# Patient Record
Sex: Female | Born: 1940 | Race: White | Hispanic: Refuse to answer | Smoking: Never smoker
Health system: Southern US, Community
[De-identification: ages and names within clinical notes are randomized; demographics above are authoritative.]

## PROBLEM LIST (undated history)

## (undated) DIAGNOSIS — R011 Cardiac murmur, unspecified: Secondary | ICD-10-CM

## (undated) DIAGNOSIS — I341 Nonrheumatic mitral (valve) prolapse: Secondary | ICD-10-CM

## (undated) DIAGNOSIS — M8580 Other specified disorders of bone density and structure, unspecified site: Secondary | ICD-10-CM

## (undated) DIAGNOSIS — K5790 Diverticulosis of intestine, part unspecified, without perforation or abscess without bleeding: Secondary | ICD-10-CM

## (undated) DIAGNOSIS — R569 Unspecified convulsions: Secondary | ICD-10-CM

## (undated) HISTORY — DX: Diverticulosis of intestine, part unspecified, without perforation or abscess without bleeding: K57.90

## (undated) HISTORY — DX: Nonrheumatic mitral (valve) prolapse: I34.1

## (undated) HISTORY — DX: Cardiac murmur, unspecified: R01.1

## (undated) HISTORY — DX: Unspecified convulsions: R56.9

## (undated) HISTORY — PX: TUBAL LIGATION: SHX77

## (undated) HISTORY — DX: Other specified disorders of bone density and structure, unspecified site: M85.80

---

## 1998-02-07 ENCOUNTER — Other Ambulatory Visit: Admission: RE | Admit: 1998-02-07 | Discharge: 1998-02-07 | Payer: Self-pay | Admitting: Obstetrics and Gynecology

## 1999-02-11 ENCOUNTER — Other Ambulatory Visit: Admission: RE | Admit: 1999-02-11 | Discharge: 1999-02-11 | Payer: Self-pay | Admitting: Obstetrics and Gynecology

## 1999-04-25 ENCOUNTER — Encounter: Admission: RE | Admit: 1999-04-25 | Discharge: 1999-04-25 | Payer: Self-pay | Admitting: Obstetrics and Gynecology

## 1999-04-25 ENCOUNTER — Encounter: Payer: Self-pay | Admitting: Obstetrics and Gynecology

## 2000-02-12 ENCOUNTER — Other Ambulatory Visit: Admission: RE | Admit: 2000-02-12 | Discharge: 2000-02-12 | Payer: Self-pay | Admitting: Obstetrics and Gynecology

## 2000-04-24 ENCOUNTER — Encounter: Payer: Self-pay | Admitting: Obstetrics and Gynecology

## 2000-04-24 ENCOUNTER — Encounter: Admission: RE | Admit: 2000-04-24 | Discharge: 2000-04-24 | Payer: Self-pay | Admitting: Obstetrics and Gynecology

## 2001-03-31 ENCOUNTER — Other Ambulatory Visit: Admission: RE | Admit: 2001-03-31 | Discharge: 2001-03-31 | Payer: Self-pay | Admitting: Obstetrics and Gynecology

## 2001-04-28 ENCOUNTER — Encounter: Admission: RE | Admit: 2001-04-28 | Discharge: 2001-04-28 | Payer: Self-pay | Admitting: Obstetrics and Gynecology

## 2001-04-28 ENCOUNTER — Encounter: Payer: Self-pay | Admitting: Obstetrics and Gynecology

## 2002-04-08 ENCOUNTER — Other Ambulatory Visit: Admission: RE | Admit: 2002-04-08 | Discharge: 2002-04-08 | Payer: Self-pay | Admitting: Obstetrics and Gynecology

## 2002-05-13 ENCOUNTER — Encounter: Admission: RE | Admit: 2002-05-13 | Discharge: 2002-05-13 | Payer: Self-pay | Admitting: Obstetrics and Gynecology

## 2002-05-13 ENCOUNTER — Encounter: Payer: Self-pay | Admitting: Obstetrics and Gynecology

## 2002-05-23 ENCOUNTER — Encounter: Payer: Self-pay | Admitting: Obstetrics and Gynecology

## 2002-05-23 ENCOUNTER — Encounter: Admission: RE | Admit: 2002-05-23 | Discharge: 2002-05-23 | Payer: Self-pay | Admitting: Obstetrics and Gynecology

## 2003-04-12 ENCOUNTER — Other Ambulatory Visit: Admission: RE | Admit: 2003-04-12 | Discharge: 2003-04-12 | Payer: Self-pay | Admitting: Obstetrics and Gynecology

## 2003-06-14 ENCOUNTER — Encounter: Admission: RE | Admit: 2003-06-14 | Discharge: 2003-06-14 | Payer: Self-pay | Admitting: Obstetrics and Gynecology

## 2004-05-17 ENCOUNTER — Other Ambulatory Visit: Admission: RE | Admit: 2004-05-17 | Discharge: 2004-05-17 | Payer: Self-pay | Admitting: Obstetrics and Gynecology

## 2004-06-20 ENCOUNTER — Encounter: Admission: RE | Admit: 2004-06-20 | Discharge: 2004-06-20 | Payer: Self-pay | Admitting: Obstetrics and Gynecology

## 2005-05-22 ENCOUNTER — Other Ambulatory Visit: Admission: RE | Admit: 2005-05-22 | Discharge: 2005-05-22 | Payer: Self-pay | Admitting: Obstetrics & Gynecology

## 2005-06-23 ENCOUNTER — Encounter: Admission: RE | Admit: 2005-06-23 | Discharge: 2005-06-23 | Payer: Self-pay | Admitting: Obstetrics and Gynecology

## 2006-06-24 ENCOUNTER — Other Ambulatory Visit: Admission: RE | Admit: 2006-06-24 | Discharge: 2006-06-24 | Payer: Self-pay | Admitting: Obstetrics & Gynecology

## 2006-06-26 ENCOUNTER — Encounter: Admission: RE | Admit: 2006-06-26 | Discharge: 2006-06-26 | Payer: Self-pay | Admitting: Obstetrics and Gynecology

## 2007-07-16 ENCOUNTER — Other Ambulatory Visit: Admission: RE | Admit: 2007-07-16 | Discharge: 2007-07-16 | Payer: Self-pay | Admitting: Obstetrics and Gynecology

## 2007-08-03 ENCOUNTER — Encounter: Admission: RE | Admit: 2007-08-03 | Discharge: 2007-08-03 | Payer: Self-pay | Admitting: Obstetrics and Gynecology

## 2007-11-18 ENCOUNTER — Encounter: Payer: Self-pay | Admitting: Pulmonary Disease

## 2008-06-14 ENCOUNTER — Ambulatory Visit: Payer: Self-pay | Admitting: Pulmonary Disease

## 2008-06-14 DIAGNOSIS — I059 Rheumatic mitral valve disease, unspecified: Secondary | ICD-10-CM | POA: Insufficient documentation

## 2008-06-14 DIAGNOSIS — J449 Chronic obstructive pulmonary disease, unspecified: Secondary | ICD-10-CM

## 2008-06-14 DIAGNOSIS — J309 Allergic rhinitis, unspecified: Secondary | ICD-10-CM | POA: Insufficient documentation

## 2008-06-14 DIAGNOSIS — R05 Cough: Secondary | ICD-10-CM

## 2008-06-14 DIAGNOSIS — J4489 Other specified chronic obstructive pulmonary disease: Secondary | ICD-10-CM | POA: Insufficient documentation

## 2008-06-19 ENCOUNTER — Ambulatory Visit: Payer: Self-pay | Admitting: Internal Medicine

## 2008-07-20 ENCOUNTER — Ambulatory Visit: Payer: Self-pay | Admitting: Pulmonary Disease

## 2008-07-20 DIAGNOSIS — J479 Bronchiectasis, uncomplicated: Secondary | ICD-10-CM

## 2008-08-15 ENCOUNTER — Encounter: Admission: RE | Admit: 2008-08-15 | Discharge: 2008-08-15 | Payer: Self-pay | Admitting: Obstetrics and Gynecology

## 2009-01-10 ENCOUNTER — Ambulatory Visit: Payer: Self-pay | Admitting: Pulmonary Disease

## 2009-01-16 ENCOUNTER — Encounter: Payer: Self-pay | Admitting: Pulmonary Disease

## 2009-08-08 ENCOUNTER — Ambulatory Visit: Payer: Self-pay | Admitting: Pulmonary Disease

## 2009-08-23 ENCOUNTER — Encounter: Admission: RE | Admit: 2009-08-23 | Discharge: 2009-08-23 | Payer: Self-pay | Admitting: Obstetrics and Gynecology

## 2009-09-05 ENCOUNTER — Telehealth (INDEPENDENT_AMBULATORY_CARE_PROVIDER_SITE_OTHER): Payer: Self-pay | Admitting: *Deleted

## 2010-01-14 ENCOUNTER — Ambulatory Visit: Payer: Self-pay | Admitting: Pulmonary Disease

## 2010-03-28 NOTE — Assessment & Plan Note (Signed)
Summary: rov for bronchiectasis   Primary Provider/Referring Provider:  Tristan Schroeder, MD  CC:  Pt is here for a 6 month f/u appt.  Pt denied sob.  Pt c/o coughing up dark yellow sputum. Pt also c/o hoarseness. Heidi Vasquez  History of Present Illness: the pt comes in today for f/u of her known bronchiectasis with airflow obstruction.  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  She has not had an acute exacerbation since the last visit, but continues to have intermittant discolored mucus at times that is hard to mobilize.  She denies any worsening of her breathing, and is maintaining her functional status.  Current Medications (verified): 1)  Calcium Carbonate-Vitamin D 600-400 Mg-Unit Tabs (Calcium Carbonate-Vitamin D) .... Take 1 Tablet By Mouth Once A Day 2)  Chlorpheniramine Maleate 4 Mg Tabs (Chlorpheniramine Maleate) .... Take 2 Tabs By Mouth At Bedtime As Needed 3)  Proair Hfa 108 (90 Base) Mcg/act  Aers (Albuterol Sulfate) .Heidi Vasquez.. 1-2 Puffs Every 4-6 Hours As Needed 4)  Symbicort 160-4.5 Mcg/act  Aero (Budesonide-Formoterol Fumarate) .... Two Puffs Twice Daily  Allergies (verified): No Known Drug Allergies  Review of Systems       The patient complains of productive cough.  The patient denies shortness of breath with activity, shortness of breath at rest, non-productive cough, coughing up blood, chest pain, irregular heartbeats, acid heartburn, indigestion, loss of appetite, weight change, abdominal pain, difficulty swallowing, sore throat, tooth/dental problems, headaches, nasal congestion/difficulty breathing through nose, sneezing, itching, ear ache, anxiety, depression, hand/feet swelling, joint stiffness or pain, rash, change in color of mucus, and fever.    Vital Signs:  Patient profile:   70 year old female Height:      66 inches Weight:      119.31 pounds BMI:      19.33 O2 Sat:      95 % on Room air Temp:     98.7 degrees F oral Pulse rate:   99 / minute BP sitting:   126 / 84  (left arm) Cuff size:   regular  Vitals Entered By: Arman Filter LPN (August 08, 2009 2:10 PM)  O2 Flow:  Room air CC: Pt is here for a 6 month f/u appt.  Pt denied sob.  Pt c/o coughing up dark yellow sputum. Pt also c/o hoarseness.  Comments Medications reviewed with patient Arman Filter LPN  August 08, 2009 2:10 PM    Physical Exam  General:  thin female in nad Nose:  no purulence noted. Lungs:  a few scattered crackles, no wheezing or rhonchi Heart:  rrr, no mrg Extremities:  no edema or cyanosis Neurologic:  alert and oriented, moves all 4.   Impression & Recommendations:  Problem # 1:  BRONCHIECTASIS (ICD-494.0) the pt is doing fairly well from this standpoint, with no recent exacerbations.  Her quantity of mucus remains limited, but she has difficulty mobilizing at times.  Will try her on a flutter valve to see if helps.  Problem # 2:  COPD, MILD (ICD-496) She has known obstructive airways disease related to her bronchiectasis, and feels that symbicort has helped her (did not see a response to qvar alone).  However, she is having issues with dysphonia that is bothering her.  Will change over to dulera as a trial, and if continues to have issues, may try her on foradil alone.  Other  Orders: Est. Patient Level III (91478)  Patient Instructions: 1)  will try flutter valve.Heidi Kitchenam and pm 2)  will try dulera in the place of symbicort for 3-4 weeks to see if it is easier on your voice...100/87mcg strength  2 inhalations am and pm, and rinse well.  Let me know if you need a prescription. 3)  stay as active as possible 4)  followup with me in 6mos.   Prescriptions: SYMBICORT 160-4.5 MCG/ACT  AERO (BUDESONIDE-FORMOTEROL FUMARATE) Two puffs twice daily  #1 x 6   Entered and Authorized by:   Barbaraann Share MD   Signed by:   Barbaraann Share MD on 08/08/2009   Method  used:   Print then Give to Patient   RxID:   [id number]

## 2010-03-28 NOTE — Progress Notes (Signed)
Summary: prescription  Phone Note Call from Patient Call back at Work Phone [phone]   Caller: Patient Call For: clance Summary of Call: Wants rx for dulera 100mg .//[facility] Initial call taken by: Darletta Moll,  September 05, 2009 9:41 AM    New/Updated Medications: DULERA 100-5 MCG/ACT AERO (MOMETASONE FURO-FORMOTEROL FUM) Take 2 puffs in am and 2 puffs in pm Prescriptions: DULERA 100-5 MCG/ACT AERO (MOMETASONE FURO-FORMOTEROL FUM) Take 2 puffs in am and 2 puffs in pm  #1 x 3   Entered by:   Abigail Miyamoto RN   Authorized by:   Barbaraann Share MD   Signed by:   Abigail Miyamoto RN on 09/05/2009   Method used:   Electronically to        [facility]  [address] [address] [id number] (retail)       [address]       [place], [place]  [address]       Ph: [phone] or [phone]       Fax: [phone]   RxID:   [id number]

## 2010-03-28 NOTE — Assessment & Plan Note (Signed)
Summary: rov for bronchiectasis/copd   Primary Heidi Vasquez/Referring Dalton Molesworth:  Tristan Schroeder, MD  CC:  f/u. pt states her breathing has been doing "okay". pt states she currently has laryngitis.  pt c/o post nasal drip. pt is never smoker.  History of Present Illness: the pt comes in today for f/u of her known bronchiectasis with copd.  She has had issues with dysphonia in the past with inhalers, but most recently has been tried on dulera, which she feels she tolerates well.  She has seen improvement in her breathing with this med.  She was doing well until most recently, and has come down with laryngitis and postnasal drip.  She denies any chest congestion or purulence.  She doesn't think her hoarseness is from the inhalers.  Current Medications (verified): 1)  Calcium Carbonate-Vitamin D 600-400 Mg-Unit Tabs (Calcium Carbonate-Vitamin D) .... Take 1 Tablet By Mouth Once A Day 2)  Chlorpheniramine Maleate 4 Mg Tabs (Chlorpheniramine Maleate) .... Take 2 Tabs By Mouth At Bedtime As Needed 3)  Proair Hfa 108 (90 Base) Mcg/act  Aers (Albuterol Sulfate) .Marland Kitchen.. 1-2 Puffs Every 4-6 Hours As Needed 4)  Dulera 100-5 Mcg/act Aero (Mometasone Furo-Formoterol Fum) .... Take 2 Puffs in Am and 2 Puffs in Pm  Allergies (verified): No Known Drug Allergies  Review of Systems       The patient complains of productive cough.  The patient denies shortness of breath with activity, shortness of breath at rest, non-productive cough, coughing up blood, chest pain, irregular heartbeats, acid heartburn, indigestion, loss of appetite, weight change, abdominal pain, difficulty swallowing, sore throat, tooth/dental problems, headaches, nasal congestion/difficulty breathing through nose, sneezing, itching, ear ache, anxiety, depression, hand/feet swelling, joint stiffness or pain, rash, change in color of mucus, and fever.    Vital Signs:  Patient profile:   70 year old female Height:      66 inches Weight:      122.25  pounds BMI:     19.80 O2 Sat:      97 % on Room air Temp:     98 degrees F oral Pulse rate:   86 / minute BP sitting:   122 / 78  (left arm) Cuff size:   regular  Vitals Entered By: Carver Fila (January 14, 2010 1:58 PM)  O2 Flow:  Room air CC: f/u. pt states her breathing has been doing "okay". pt states she currently has laryngitis.  pt c/o post nasal drip. pt is never smoker Comments meds and allergies updated Phone number updated Carver Fila  January 14, 2010 1:57 PM    Physical Exam  General:  thin female in nad Mouth:  mild thrush coating tongue. Lungs:  fairly clear to auscultation no wheezing or rhonchi Heart:  rrr Extremities:  no edema or cyanosis  Neurologic:  alert and oriented, moves all 4.   Impression & Recommendations:  Problem # 1:  BRONCHIECTASIS (ICD-494.0) the pt has not had a recent acute flare, and denies any significant cough.  I have reviewed with her again the "red flags" which indicate the need for abx.  Problem # 2:  COPD, MILD (ICD-496) the pt appears to be doing well with dulera, and I have asked her to continue with this.    Medications Added to Medication List This Visit: 1)  Nystatin 100000 Unit/ml Susp (Nystatin) .... Take one teaspoonful by mouth swish & swallow four times a day x 5 days  Other Orders: Est. Patient Level III (46962)  Patient Instructions: 1)  stay on dulera 2)  will give you a prescription for nystatin suspension to use as needed for "sore mouth". 3)  followup with me in 6mos.    Prescriptions: NYSTATIN 100000 UNIT/ML  SUSP (NYSTATIN) Take one teaspoonful by mouth swish & swallow four times a day x 5 days  #qs x 1   Entered and Authorized by:   Barbaraann Share MD   Signed by:   Barbaraann Share MD on 01/14/2010   Method used:   Print then Give to Patient   RxID:   [id number] DULERA 100-5 MCG/ACT AERO (MOMETASONE FURO-FORMOTEROL FUM) Take 2 puffs in am and 2 puffs in pm  #1 x 11   Entered and Authorized by:    Barbaraann Share MD   Signed by:   Barbaraann Share MD on 01/14/2010   Method used:   Print then Give to Patient   RxID:   [id number]    Immunization History:  Influenza Immunization History:    Influenza:  historical (11/24/2009)

## 2010-08-16 ENCOUNTER — Other Ambulatory Visit: Payer: Self-pay | Admitting: Obstetrics and Gynecology

## 2010-08-16 DIAGNOSIS — Z1231 Encounter for screening mammogram for malignant neoplasm of breast: Secondary | ICD-10-CM

## 2010-08-26 ENCOUNTER — Ambulatory Visit
Admission: RE | Admit: 2010-08-26 | Discharge: 2010-08-26 | Disposition: A | Discharge status: Home / Self Care | Payer: BC Managed Care – PPO | Source: Ambulatory Visit | Attending: Obstetrics and Gynecology | Admitting: Obstetrics and Gynecology

## 2010-08-26 DIAGNOSIS — Z1231 Encounter for screening mammogram for malignant neoplasm of breast: Secondary | ICD-10-CM

## 2010-10-01 ENCOUNTER — Encounter: Payer: Self-pay | Admitting: Pulmonary Disease

## 2010-10-02 ENCOUNTER — Encounter: Payer: Self-pay | Admitting: Pulmonary Disease

## 2010-10-02 ENCOUNTER — Ambulatory Visit: Payer: BC Managed Care – PPO | Admitting: Pulmonary Disease

## 2010-10-02 ENCOUNTER — Ambulatory Visit (INDEPENDENT_AMBULATORY_CARE_PROVIDER_SITE_OTHER): Payer: BC Managed Care – PPO | Admitting: Pulmonary Disease

## 2010-10-02 DIAGNOSIS — J449 Chronic obstructive pulmonary disease, unspecified: Secondary | ICD-10-CM

## 2010-10-02 DIAGNOSIS — J479 Bronchiectasis, uncomplicated: Secondary | ICD-10-CM

## 2010-10-02 NOTE — Patient Instructions (Signed)
Will try off dulera for a period of time to see if your breathing changes.  If you see worsening symptoms, get back on dulera and let us know. Can use albuterol 2 puffs up to every 6hrs if needed for rescue followup with me in 6mos.

## 2010-10-02 NOTE — Progress Notes (Signed)
  Subjective:    Patient ID: Heidi Vasquez, female    DOB: [birth year], 70 y.o.   MRN: [id number]  HPI The pt comes in today for f/u of her known bronchiectasis with mild airflow obstruction.  She is doing fairly well, with no recent chest infection or worsening of her breathing.  She does have an intermittant cough, with only scant mucus production.  She also has ongoing hoarseness that I suspect is due to her inhaler medication   Review of Systems  Constitutional: Negative.  Negative for fever and unexpected weight change.  HENT: Negative.  Negative for ear pain, nosebleeds, congestion, sore throat, rhinorrhea, sneezing, trouble swallowing, dental problem, postnasal drip and sinus pressure.   Eyes: Negative.  Negative for redness and itching.  Respiratory: Positive for cough. Negative for chest tightness, shortness of breath and wheezing.   Cardiovascular: Negative.  Negative for palpitations and leg swelling.  Gastrointestinal: Negative.  Negative for nausea and vomiting.  Genitourinary: Negative.  Negative for dysuria.  Musculoskeletal: Negative.  Negative for joint swelling.  Skin: Negative.  Negative for rash.  Neurological: Negative.  Negative for headaches.  Hematological: Negative.  Does not bruise/bleed easily.  Psychiatric/Behavioral: Negative.  Negative for dysphoric mood. The patient is not nervous/anxious.        Objective:   Physical Exam Wd female in nad Nares without purulence or discharge Chest with a few scattered crackles, no wheezing or rhonchi Cor with rrr LE without edema, no cyanosis Alert, oriented, moves all 4        Assessment & Plan:

## 2010-10-06 ENCOUNTER — Encounter: Payer: Self-pay | Admitting: Pulmonary Disease

## 2010-10-06 NOTE — Assessment & Plan Note (Signed)
The pt has mild airflow obstruction related to her bronchiectasis, but has not seen a huge change in symptoms with LABA/ICS medication.  She has been having ongoing issues with upper airway cough and hoarseness that are probably related to her inhalers?  Will try her off dulera to see if she notices a difference.  If she sees worsening of breathing or increase in mucus, may have to restart some type of inhaler medication.

## 2010-10-06 NOTE — Assessment & Plan Note (Signed)
The pt has not had an acute exacerbation since the last visit, and is not having chest congestion or significant purulence.

## 2011-01-06 ENCOUNTER — Other Ambulatory Visit: Payer: Self-pay | Admitting: *Deleted

## 2011-01-06 MED ORDER — MOMETASONE FURO-FORMOTEROL FUM 100-5 MCG/ACT IN AERO: 2.0000 | INHALATION_SPRAY | Freq: Two times a day (BID) | RESPIRATORY_TRACT | Status: DC

## 2011-01-06 NOTE — Telephone Encounter (Signed)
Received faxed refill request for [facility] 100/5 from [facility]. Pt last seen 10-02-10 and to follow up in 6 months. Refills sent.

## 2011-08-06 ENCOUNTER — Other Ambulatory Visit: Payer: Self-pay | Admitting: Obstetrics and Gynecology

## 2011-08-06 DIAGNOSIS — Z1231 Encounter for screening mammogram for malignant neoplasm of breast: Secondary | ICD-10-CM

## 2011-08-06 DIAGNOSIS — M8580 Other specified disorders of bone density and structure, unspecified site: Secondary | ICD-10-CM

## 2011-08-29 ENCOUNTER — Ambulatory Visit
Admission: RE | Admit: 2011-08-29 | Discharge: 2011-08-29 | Disposition: A | Discharge status: Home / Self Care | Payer: BC Managed Care – PPO | Source: Ambulatory Visit | Attending: Obstetrics and Gynecology | Admitting: Obstetrics and Gynecology

## 2011-08-29 DIAGNOSIS — Z1231 Encounter for screening mammogram for malignant neoplasm of breast: Secondary | ICD-10-CM

## 2011-08-29 DIAGNOSIS — M8580 Other specified disorders of bone density and structure, unspecified site: Secondary | ICD-10-CM

## 2012-08-20 ENCOUNTER — Other Ambulatory Visit: Payer: Self-pay

## 2012-08-20 DIAGNOSIS — Z1231 Encounter for screening mammogram for malignant neoplasm of breast: Secondary | ICD-10-CM

## 2012-09-06 ENCOUNTER — Ambulatory Visit
Admission: RE | Admit: 2012-09-06 | Discharge: 2012-09-06 | Disposition: A | Discharge status: Home / Self Care | Payer: BC Managed Care – PPO | Source: Ambulatory Visit

## 2012-09-06 DIAGNOSIS — Z1231 Encounter for screening mammogram for malignant neoplasm of breast: Secondary | ICD-10-CM

## 2012-10-20 ENCOUNTER — Encounter: Payer: Self-pay | Admitting: *Deleted

## 2012-10-20 ENCOUNTER — Encounter: Payer: Self-pay | Admitting: Obstetrics and Gynecology

## 2012-10-20 ENCOUNTER — Ambulatory Visit (INDEPENDENT_AMBULATORY_CARE_PROVIDER_SITE_OTHER): Payer: BC Managed Care – PPO | Admitting: Obstetrics and Gynecology

## 2012-10-20 VITALS — BP 120/76 | HR 72 | Resp 14 | Ht 65.0 in | Wt 124.0 lb

## 2012-10-20 DIAGNOSIS — Z01419 Encounter for gynecological examination (general) (routine) without abnormal findings: Secondary | ICD-10-CM

## 2012-10-20 DIAGNOSIS — Z0000 Encounter for general adult medical examination without abnormal findings: Secondary | ICD-10-CM

## 2012-10-20 LAB — LIPID PANEL
HDL: 68 mg/dL (ref >39)
LDL Cholesterol: 81 mg/dL (ref 0–99)
Total CHOL/HDL Ratio: 2.6 Ratio
VLDL: 26 mg/dL (ref 0–40)

## 2012-10-20 LAB — POCT URINALYSIS DIPSTICK
Blood, UA: 0
Urobilinogen, UA: NEGATIVE
pH, UA: 5

## 2012-10-20 LAB — HEMOGLOBIN, FINGERSTICK: Hemoglobin, fingerstick: 13.9 g/dL (ref 12.0–16.0)

## 2012-10-20 NOTE — Progress Notes (Signed)
72 y.o.   Divorced    Caucasian   female   G2P2   here for annual exam.    No LMP recorded. Patient is postmenopausal.          Sexually active: no  The current method of family planning is post menopausal status.    Exercising: Walking 5x/wk, weight bearing exercise couple x's/wk Last mammogram:  08/29/12 Last pap smear:  07/27/2009- Negative  History of abnormal pap: no Smoking: no Alcohol: no Last colonoscopy: 04/2008- q 5 years Last Bone Density:  08/29/11 Last tetanus shot: 10/20/11  Last cholesterol check:  Couple years ago   Hgb:   13.9g             Urine: Trace Leuks   Family History  Problem Relation Age of Onset  . Stroke Mother   . Diabetes Mother   . Hypertension Mother   . Colon cancer Father   . Breast cancer Sister     Patient Active Problem List   Diagnosis Date Noted  . 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 MITRAL VALVE PROLAPSE 06/14/2008  . ALLERGIC RHINITIS 06/14/2008  . 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 COUGH 06/14/2008    Past Medical History  Diagnosis Date  . Allergic rhinitis   . MVP (mitral valve prolapse)   . Osteopenia     Intolerant of Fosamax  . Diverticulosis     Past Surgical History  Procedure Laterality Date  . Tubal ligation      Allergies: Review of patient's allergies indicates no known allergies.  Current Outpatient Prescriptions  Medication Sig Dispense Refill  . albuterol (PROAIR HFA) 108 (90 BASE) MCG/ACT inhaler Inhale 2 puffs into the lungs every 6 (six) hours as needed.        . Calcium Carbonate-Vit D-Min 600-400 MG-UNIT TABS Take 1 tablet by mouth daily.        . chlorpheniramine (CHLOR-TRIMETON) 4 MG tablet Take 4 mg by mouth 2 (two) times daily as needed.        . mometasone-formoterol (DULERA) 100-5 MCG/ACT AERO Inhale 2 puffs into the lungs 2 (two) times daily.  1 Inhaler  5   No current facility-administered medications for this visit.    ROS: Pertinent items are noted in HPI.  Social Hx:  Divorced, two children, works as an  Corporate treasurer asst  Exam:    BP 120/76  Pulse 72  Resp 14  Ht 5\' 5"  (1.651 m)  Wt 124 lb (56.246 kg)  BMI 20.63 kg/m2 Ht and Wt stable from last year  Wt Readings from Last 3 Encounters:  10/20/12 124 lb (56.246 kg)  10/02/10 118 lb (53.524 kg)  01/14/10 122 lb 4 oz (55.452 kg)     Ht Readings from Last 3 Encounters:  10/20/12 5\' 5"  (1.651 m)  10/02/10 5\' 8"  (1.727 m)  01/14/10 5\' 6"  (1.676 m)    General appearance: alert, cooperative and appears stated age Head: Normocephalic, without obvious abnormality, atraumatic Neck: no adenopathy, supple, symmetrical, trachea midline and thyroid not enlarged, symmetric, no tenderness/mass/nodules Lungs: clear to auscultation bilaterally Breasts: Inspection negative, No nipple retraction or dimpling, No nipple discharge or bleeding, No axillary or supraclavicular adenopathy, Normal to palpation without dominant masses Heart: regular rate and rhythm Abdomen: soft, non-tender; bowel sounds normal; no masses,  no organomegaly Extremities: extremities normal, atraumatic, no cyanosis or edema Skin: Skin color, texture, turgor normal. No rashes or lesions Lymph nodes: Cervical, supraclavicular, and axillary nodes normal. No abnormal inguinal nodes palpated Neurologic: Grossly normal  Pelvic: External genitalia:  no lesions              Urethra:  normal appearing urethra with no masses, tenderness or lesions              Bartholins and Skenes: normal                 Vagina: normal appearing vagina with normal color and discharge, no lesions              Cervix: normal appearance              Pap taken: no        Bimanual Exam:  Uterus:  uterus is normal size, shape, consistency and nontender, AF, mobile                                      Adnexa: normal adnexa in size, nontender and no masses                                      Rectovaginal: Confirms                                      Anus:  normal sphincter tone, no lesions  A: normal  menopausal exam,n quit HRT June 2011     Sister with premeno breast cancer, colon ca in father     Hematemesis on fosamax       P:     mammogram counseled on breast self exam, mammography screening, adequate intake of calcium and vitamin D, diet and exercise return annually or prn   Rec: repeat BMD since there was such a significant delince in BMD when pt stopped her HRT   An After Visit Summary was printed and given to the patient.

## 2012-10-20 NOTE — Patient Instructions (Signed)

## 2012-10-22 ENCOUNTER — Telehealth: Payer: Self-pay | Admitting: Obstetrics and Gynecology

## 2012-10-22 NOTE — Telephone Encounter (Signed)
Patient is returning your call.  

## 2012-10-26 ENCOUNTER — Telehealth: Payer: Self-pay | Admitting: Obstetrics and Gynecology

## 2012-10-26 ENCOUNTER — Telehealth: Payer: Self-pay | Admitting: Gynecology

## 2012-10-26 NOTE — Telephone Encounter (Signed)
Patient needs an appointment for her bone density test.

## 2012-10-27 NOTE — Telephone Encounter (Addendum)
Last BMD in Epic 08-29-11. CR recommended at last visit that she repeat due to significant decrease when pt stopped HRT. OK to send order?

## 2012-10-28 NOTE — Telephone Encounter (Signed)
Pt notified of lab results on 10/26/12 cm

## 2012-11-01 NOTE — Telephone Encounter (Signed)
yes

## 2012-11-02 ENCOUNTER — Other Ambulatory Visit: Payer: Self-pay | Admitting: Orthopedic Surgery

## 2012-11-02 DIAGNOSIS — M8580 Other specified disorders of bone density and structure, unspecified site: Secondary | ICD-10-CM

## 2012-11-02 NOTE — Telephone Encounter (Signed)
[facility] for [facility].   Call to pt. Left message on VM that order has been entered for bone density at [facility]. Pt can call and make appt at her convenience.

## 2012-11-30 ENCOUNTER — Ambulatory Visit
Admission: RE | Admit: 2012-11-30 | Discharge: 2012-11-30 | Disposition: A | Discharge status: Home / Self Care | Payer: BC Managed Care – PPO | Source: Ambulatory Visit | Attending: Gynecology | Admitting: Gynecology

## 2012-11-30 DIAGNOSIS — M8580 Other specified disorders of bone density and structure, unspecified site: Secondary | ICD-10-CM

## 2012-12-10 ENCOUNTER — Telehealth: Payer: Self-pay | Admitting: Gynecology

## 2012-12-10 NOTE — Telephone Encounter (Signed)
Dr. Farrel Gobble, patient calling for results of Dexa. Can your review and advise?  *RADIOLOGY REPORT*  Clinical Data: 72 year old postmenopausal female with history of  low bone mass. The patient takes vitamin D. She has taken  hormones and Fosamax in the past.  DUAL X-RAY ABSORPTIOMETRY (DXA) FOR BONE MINERAL DENSITY  AP LUMBAR SPINE (L1 - L4)  Bone Mineral Density (BMD): 0.841 g/cm2  Young Adult T Score: -1.9  Z Score: 0.4  LEFT FEMUR (NECK)  Bone Mineral Density (BMD): 0.570 g/cm2  Young Adult T Score: -2.5  Z Score: -0.6  ASSESSMENT: Patient's diagnostic category is OSTEOPOROSIS by WHO  Criteria.  FRACTURE RISK: UNKNOWN  FRAX: World Health Organization FRAX assessment of absolute  fracture risk is not calculated for this patient because the  patient has osteoporosis and a history of bone building therapy.  Comparison: There has been no significant change in BMD in the  spine or total left hip as compared to baseline study dated  04/28/2001 or as compared to 08/29/2011.  RECOMMENDATIONS:  All patients should ensure an adequate intake of dietary calcium  (1200mg  daily) and vitamin D (800 IU daily) unless contraindicated.  The National Osteoporosis Foundation recommends that FDA-approved  medical therapies be considered in postmenopausal women and mean  age 41 or older with a:  1) Hip or vertebral (clinical or morphometric) fracture.  2) T-score of -2.5 or lower at the spine or hip.  3) Ten-year fracture probability by FRAX of 3% or greater for hip  fracture or 20% or greater for major osteoporotic fracture.  FOLLOW-UP:  People with diagnosed cases of osteoporosis or at high risk for  fracture should have regular bone mineral density tests. For  patients eligible for Medicare, routine testing is allowed once  every 2 years. The testing frequency can be increased to one year  for patients who have rapidly progressing disease, those who are  receiving or discontinuing medical  therapy to restore bone mass, or  have additional risk factors.  Original Report Authenticated By: Cain Saupe, M.D.

## 2012-12-10 NOTE — Telephone Encounter (Signed)
Patient is calling for results from a bone density test.

## 2012-12-14 NOTE — Telephone Encounter (Signed)
Compared multiple DEXA going back over several years, bones appear unchanged, stable. Please check with pt, report indicates that she was on fosamax and i cannot find in chart, ask her and if no, can we contact the breast center regarding correction and getting FRAX

## 2012-12-14 NOTE — Telephone Encounter (Signed)
Spoke with patient. Message from Dr. Farrel Gobble given. Patient verbalized understanding.   She states she was on Fosamax previously but she had to dc due to spitting up blood.

## 2012-12-16 ENCOUNTER — Encounter (INDEPENDENT_AMBULATORY_CARE_PROVIDER_SITE_OTHER): Payer: Self-pay

## 2012-12-16 NOTE — Telephone Encounter (Addendum)
Patient returned call. She was on Fosamax started by Dr. Tresa Res In September 07, 2001 and stopped in September  2003.

## 2012-12-16 NOTE — Telephone Encounter (Signed)
Spoke with patient. She is going to look through records and call back. States that Dr. Tresa Res started her on Fosamax in approximately 2010 or 2011.

## 2012-12-17 NOTE — Telephone Encounter (Signed)
Spoke with [facility], they will ask the provider to see if they can do a recalculation.

## 2012-12-17 NOTE — Telephone Encounter (Signed)
Can we ask them to do the frax then since it was such a short time

## 2012-12-27 NOTE — Telephone Encounter (Signed)
Dr. Farrel Gobble,  [facility] re-calculated Frax on report.

## 2012-12-30 ENCOUNTER — Other Ambulatory Visit: Payer: Self-pay

## 2013-08-02 ENCOUNTER — Other Ambulatory Visit: Payer: Self-pay

## 2013-08-02 DIAGNOSIS — Z1231 Encounter for screening mammogram for malignant neoplasm of breast: Secondary | ICD-10-CM

## 2013-09-09 ENCOUNTER — Ambulatory Visit
Admission: RE | Admit: 2013-09-09 | Discharge: 2013-09-09 | Disposition: A | Discharge status: Home / Self Care | Payer: BC Managed Care – PPO | Source: Ambulatory Visit

## 2013-09-09 DIAGNOSIS — Z1231 Encounter for screening mammogram for malignant neoplasm of breast: Secondary | ICD-10-CM

## 2013-10-21 ENCOUNTER — Ambulatory Visit (INDEPENDENT_AMBULATORY_CARE_PROVIDER_SITE_OTHER): Payer: BC Managed Care – PPO | Admitting: Gynecology

## 2013-10-21 ENCOUNTER — Encounter: Payer: Self-pay | Admitting: Gynecology

## 2013-10-21 VITALS — BP 124/76 | HR 80 | Resp 18 | Ht 65.5 in | Wt 125.0 lb

## 2013-10-21 DIAGNOSIS — Z23 Encounter for immunization: Secondary | ICD-10-CM

## 2013-10-21 DIAGNOSIS — Z0000 Encounter for general adult medical examination without abnormal findings: Secondary | ICD-10-CM

## 2013-10-21 DIAGNOSIS — Z01419 Encounter for gynecological examination (general) (routine) without abnormal findings: Secondary | ICD-10-CM

## 2013-10-21 LAB — POCT URINALYSIS DIPSTICK
UROBILINOGEN UA: NEGATIVE
pH, UA: 5

## 2013-10-21 LAB — HEMOGLOBIN, FINGERSTICK: HEMOGLOBIN, FINGERSTICK: 12.1 g/dL (ref 12.0–16.0)

## 2013-10-21 NOTE — Addendum Note (Signed)
Addended by: Alfonzo Feller on: 10/21/2013 02:46 PM   Modules accepted: Orders

## 2013-10-21 NOTE — Progress Notes (Signed)
73 y.o. Divorced Caucasian female   G2P2 here for annual exam. Pt reports menses are absent due to Menopause. She occasionally  report hot flashes, does not have night sweats, does not have vaginal dryness.  She is not using lubricants.  She does not report post-menopasual bleeding.  Pt had DEXA 2014, FRAX 4.8%/13% for hip and major fx-pt not interested in treatment.  Patient's last menstrual period was 02/25/1992.          Sexually active: No.  The current method of family planning is post menopausal status.    Exercising: Yes.    walking, weight bearing qd, 1-2x/wk Last pap: 07/27/09 Neg  Abnormal PAP:  no Mammogram: 09/12/13 Bi-Rads 1 BSE: occassionally Colonoscopy:  09/07/13- Normal f/u in 5 years ago DEXA: 12/01/12  Alcohol: no Tobacco: no  Hgb: 12.1  ; Urine:  Leuks 2  Health Maintenance  Topic Date Due  . Tetanus/tdap  09/19/1959  . Colonoscopy  09/19/1990  . Zostavax  09/18/2000  . Influenza Vaccine  09/24/2013  . Mammogram  09/10/2015  . Pneumococcal Polysaccharide Vaccine Age 34 And Over  Completed    Family History  Problem Relation Age of Onset  . Stroke Mother   . Diabetes Mother   . Hypertension Mother   . Colon cancer Father   . Breast cancer Sister     Patient Active Problem List   Diagnosis Date Noted  . 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 MITRAL VALVE PROLAPSE 06/14/2008  . ALLERGIC RHINITIS 06/14/2008  . 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 COUGH 06/14/2008    Past Medical History  Diagnosis Date  . Allergic rhinitis   . MVP (mitral valve prolapse)   . Osteopenia     Intolerant of Fosamax  . Diverticulosis     Past Surgical History  Procedure Laterality Date  . Tubal ligation      Allergies: Review of patient's allergies indicates no known allergies.  Current Outpatient Prescriptions  Medication Sig Dispense Refill  . albuterol (PROAIR HFA) 108 (90 BASE) MCG/ACT inhaler Inhale 2 puffs into the lungs every 6 (six) hours as needed.        . Calcium  Carbonate-Vit D-Min 600-400 MG-UNIT TABS Take 1 tablet by mouth daily.        . chlorpheniramine (CHLOR-TRIMETON) 4 MG tablet Take 4 mg by mouth 2 (two) times daily as needed.        . mometasone-formoterol (DULERA) 100-5 MCG/ACT AERO Inhale 2 puffs into the lungs 2 (two) times daily.  1 Inhaler  5   No current facility-administered medications for this visit.    ROS: Pertinent items are noted in HPI.  Exam:    LMP 02/25/1992 Weight change: @WEIGHTCHANGE @ Last 3 height recordings:  Ht Readings from Last 3 Encounters:  10/20/12 5\' 5"  (1.651 m)  10/02/10 5\' 8"  (1.727 m)  01/14/10 5\' 6"  (1.676 m)   General appearance: alert, cooperative and appears stated age Head: Normocephalic, without obvious abnormality, atraumatic Neck: no adenopathy, no carotid bruit, no JVD, supple, symmetrical, trachea midline and thyroid not enlarged, symmetric, no tenderness/mass/nodules Lungs: clear to auscultation bilaterally Breasts: normal appearance, no masses or tenderness Heart: regular rate and rhythm, S1, S2 normal, no murmur, click, rub or gallop Abdomen: soft, non-tender; bowel sounds normal; no masses,  no organomegaly Extremities: extremities normal, atraumatic, no cyanosis or edema Skin: Skin color, texture, turgor normal. No rashes or lesions Lymph nodes: Cervical, supraclavicular, and axillary nodes normal. no inguinal nodes palpated Neurologic: Grossly normal   Pelvic: External  genitalia:  no lesions              Urethra: normal appearing urethra with no masses, tenderness or lesions              Bartholins and Skenes: Bartholin's, Urethra, Skene's normal, varicosities                 Vagina: atrophic              Cervix: normal appearance              Pap taken: No.        Bimanual Exam:  Uterus:  uterus is normal size, shape, consistency and nontender                                      Adnexa:    no masses                                      Rectovaginal: Confirms                                       Anus:  normal sphincter tone, no lesions      1. Routine gynecological examination  counseled on breast self exam, mammography screening, osteoporosis return annually or prn  Discussed PAP guideline changes, importance of weight bearing exercises, calcium, vit D and balanced diet.   2. Laboratory examination ordered as part of a routine general medical examination  - Hemoglobin, fingerstick - POCT Urinalysis Dipstick  An After Visit Summary was printed and given to the patient.

## 2013-12-26 ENCOUNTER — Encounter: Payer: Self-pay | Admitting: Gynecology

## 2014-01-25 ENCOUNTER — Telehealth: Payer: Self-pay

## 2014-01-25 NOTE — Telephone Encounter (Signed)
lmtcb to reschedule AEX with Dr. Lathrop 

## 2014-08-29 ENCOUNTER — Other Ambulatory Visit: Payer: Self-pay

## 2014-08-29 DIAGNOSIS — Z1231 Encounter for screening mammogram for malignant neoplasm of breast: Secondary | ICD-10-CM

## 2014-09-28 ENCOUNTER — Ambulatory Visit
Admission: RE | Admit: 2014-09-28 | Discharge: 2014-09-28 | Disposition: A | Discharge status: Home / Self Care | Payer: BC Managed Care – PPO | Source: Ambulatory Visit

## 2014-09-28 DIAGNOSIS — Z1231 Encounter for screening mammogram for malignant neoplasm of breast: Secondary | ICD-10-CM

## 2014-10-02 ENCOUNTER — Other Ambulatory Visit: Payer: Self-pay | Admitting: Obstetrics and Gynecology

## 2014-10-02 DIAGNOSIS — R928 Other abnormal and inconclusive findings on diagnostic imaging of breast: Secondary | ICD-10-CM

## 2014-10-06 ENCOUNTER — Ambulatory Visit
Admission: RE | Admit: 2014-10-06 | Discharge: 2014-10-06 | Disposition: A | Discharge status: Home / Self Care | Payer: BC Managed Care – PPO | Source: Ambulatory Visit | Attending: Obstetrics and Gynecology | Admitting: Obstetrics and Gynecology

## 2014-10-06 DIAGNOSIS — R928 Other abnormal and inconclusive findings on diagnostic imaging of breast: Secondary | ICD-10-CM

## 2014-10-27 ENCOUNTER — Ambulatory Visit: Payer: BC Managed Care – PPO | Admitting: Gynecology

## 2014-10-31 ENCOUNTER — Ambulatory Visit: Payer: BC Managed Care – PPO | Admitting: Certified Nurse Midwife

## 2014-11-08 ENCOUNTER — Encounter: Payer: Self-pay | Admitting: Obstetrics and Gynecology

## 2014-11-08 ENCOUNTER — Ambulatory Visit (INDEPENDENT_AMBULATORY_CARE_PROVIDER_SITE_OTHER): Payer: BC Managed Care – PPO | Admitting: Obstetrics and Gynecology

## 2014-11-08 VITALS — BP 126/78 | HR 88 | Resp 14 | Ht 65.0 in | Wt 121.0 lb

## 2014-11-08 DIAGNOSIS — Z0000 Encounter for general adult medical examination without abnormal findings: Secondary | ICD-10-CM | POA: Diagnosis not present

## 2014-11-08 DIAGNOSIS — Z01419 Encounter for gynecological examination (general) (routine) without abnormal findings: Secondary | ICD-10-CM

## 2014-11-08 DIAGNOSIS — M810 Age-related osteoporosis without current pathological fracture: Secondary | ICD-10-CM

## 2014-11-08 LAB — COMPREHENSIVE METABOLIC PANEL
ALBUMIN: 4.5 g/dL (ref 3.6–5.1)
ALT: 21 U/L (ref 6–29)
AST: 25 U/L (ref 10–35)
Alkaline Phosphatase: 100 U/L (ref 33–130)
BUN: 12 mg/dL (ref 7–25)
CHLORIDE: 102 mmol/L (ref 98–110)
CO2: 28 mmol/L (ref 20–31)
CREATININE: 0.83 mg/dL (ref 0.60–0.93)
Calcium: 9.6 mg/dL (ref 8.6–10.4)
Glucose, Bld: 89 mg/dL (ref 65–99)
POTASSIUM: 4.6 mmol/L (ref 3.5–5.3)
Sodium: 140 mmol/L (ref 135–146)
Total Bilirubin: 1 mg/dL (ref 0.2–1.2)
Total Protein: 7.3 g/dL (ref 6.1–8.1)

## 2014-11-08 LAB — CBC
HCT: 40.6 % (ref 36.0–46.0)
HEMOGLOBIN: 13.7 g/dL (ref 12.0–15.0)
MCH: 30.5 pg (ref 26.0–34.0)
MCHC: 33.7 g/dL (ref 30.0–36.0)
MCV: 90.4 fL (ref 78.0–100.0)
MPV: 10.3 fL (ref 8.6–12.4)
Platelets: 327 10*3/uL (ref 150–400)
RBC: 4.49 MIL/uL (ref 3.87–5.11)
RDW: 13.2 % (ref 11.5–15.5)
WBC: 7.5 10*3/uL (ref 4.0–10.5)

## 2014-11-08 NOTE — Progress Notes (Signed)
Patient ID: Heidi Vasquez, female   DOB: Aug 02, 1940, 74 y.o.   MRN: [id number] 74 y.o. G2P2 DivorcedCaucasianF here for annual exam.  H/O osteoporosis, previously declined medication. It has been 2 years from her last DEXA. In the past she was on Fosamax and coughed up blood, she stopped taking it. Never had any other problems (many years ago).  No vaginal bleeding. Not sexually active.     Patient's last menstrual period was 02/25/1992.          Sexually active: No.  The current method of family planning is tubal ligation.    Exercising: Yes.    walking, weights Smoker:  no  Health Maintenance: Pap:  07-27-09 WNL History of abnormal Pap:  no MMG:  10-06-14  Probably benign right breast calcifications- DX MM in 6 months  Colonoscopy:  2015 repeat in 5 years  BMD:   12-01-12 osteopenia  TDaP:  10-18-13 Gardasil: N/A   reports that she has never smoked. She has never used smokeless tobacco. She reports that she does not drink alcohol or use illicit drugs. She has 3 grandchildren, local.   Past Medical History  Diagnosis Date  . Allergic rhinitis   . MVP (mitral valve prolapse)   . Osteopenia     Intolerant of Fosamax  . Diverticulosis     Past Surgical History  Procedure Laterality Date  . Tubal ligation      Current Outpatient Prescriptions  Medication Sig Dispense Refill  . Multiple Vitamins-Minerals (MULTIVITAMIN PO) Take by mouth.     No current facility-administered medications for this visit.    Family History  Problem Relation Age of Onset  . Stroke Mother   . Diabetes Mother   . Hypertension Mother   . Colon cancer Father   . Breast cancer Sister     Review of Systems  Constitutional: Negative.   HENT: Negative.   Eyes: Negative.   Respiratory: Negative.   Cardiovascular: Negative.   Gastrointestinal: Negative.   Endocrine: Negative.        Hair loss   Genitourinary: Negative.   Musculoskeletal: Negative.   Skin: Negative.   Allergic/Immunologic:  Negative.   Neurological: Negative.   Psychiatric/Behavioral: Negative.     Exam:   BP 126/78 mmHg  Pulse 88  Resp 14  Ht 5\' 5"  (1.651 m)  Wt 121 lb (54.885 kg)  BMI 20.14 kg/m2  LMP 02/25/1992  Weight change: @WEIGHTCHANGE @ Height:   Height: 5\' 5"  (165.1 cm)  Ht Readings from Last 3 Encounters:  11/08/14 5\' 5"  (1.651 m)  10/21/13 5' 5.5" (1.664 m)  10/20/12 5\' 5"  (1.651 m)    General appearance: alert, cooperative and appears stated age Head: Normocephalic, without obvious abnormality, atraumatic Neck: no adenopathy, supple, symmetrical, trachea midline and thyroid normal to inspection and palpation Lungs: clear to auscultation bilaterally Breasts: normal appearance, no masses or tenderness Heart: regular rate and rhythm Abdomen: soft, non-tender; bowel sounds normal; no masses,  no organomegaly Extremities: extremities normal, atraumatic, no cyanosis or edema Skin: Skin color, texture, turgor normal. No rashes or lesions Lymph nodes: Cervical, supraclavicular, and axillary nodes normal. No abnormal inguinal nodes palpated Neurologic: Grossly normal   Pelvic: External genitalia:  no lesions              Urethra:  normal appearing urethra with no masses, tenderness or lesions              Bartholins and Skenes: normal  Vagina: normal appearing vagina with normal color and discharge, no lesions. Atrophic vaginal mucosa.              Cervix: no lesions               Bimanual Exam:  Uterus:  normal size, contour, position, consistency, mobility, non-tender              Adnexa: no mass, fullness, tenderness               Rectovaginal: Confirms               Anus:  normal sphincter tone, no lesions  Chaperone was present for exam.  A:  Well Woman with normal exam  Osteoporosis, discussed risk of fracture and consequences. She declines treatment at this time.     P:   She declines DEXA this year, may do it next year  CBC, CMP, vit D  Normal lipids 2 years  ago  Continue calcium, vit D and exercise  Mammogram and colonoscopy UTD  Discussed breast self exam  Discussed paps, she declines  Given information on osteoporosis and prolia

## 2014-11-08 NOTE — Patient Instructions (Addendum)
Denosumab injection What is this medicine? DENOSUMAB (den oh sue mab) slows bone breakdown. Prolia is used to treat osteoporosis in women after menopause and in men. Xgeva is used to prevent bone fractures and other bone problems caused by cancer bone metastases. Xgeva is also used to treat giant cell tumor of the bone. This medicine may be used for other purposes; ask your health care provider or pharmacist if you have questions. COMMON BRAND NAME(S): Prolia, XGEVA What should I tell my health care provider before I take this medicine? They need to know if you have any of these conditions: -dental disease -eczema -infection or history of infections -kidney disease or on dialysis -low blood calcium or vitamin D -malabsorption syndrome -scheduled to have surgery or tooth extraction -taking medicine that contains denosumab -thyroid or parathyroid disease -an unusual reaction to denosumab, other medicines, foods, dyes, or preservatives -pregnant or trying to get pregnant -breast-feeding How should I use this medicine? This medicine is for injection under the skin. It is given by a health care professional in a hospital or clinic setting. If you are getting Prolia, a special MedGuide will be given to you by the pharmacist with each prescription and refill. Be sure to read this information carefully each time. For Prolia, talk to your pediatrician regarding the use of this medicine in children. Special care may be needed. For Xgeva, talk to your pediatrician regarding the use of this medicine in children. While this drug may be prescribed for children as young as 13 years for selected conditions, precautions do apply. Overdosage: If you think you've taken too much of this medicine contact a poison control center or emergency room at once. Overdosage: If you think you have taken too much of this medicine contact a poison control center or emergency room at once. NOTE: This medicine is only for  you. Do not share this medicine with others. What if I miss a dose? It is important not to miss your dose. Call your doctor or health care professional if you are unable to keep an appointment. What may interact with this medicine? Do not take this medicine with any of the following medications: -other medicines containing denosumab This medicine may also interact with the following medications: -medicines that suppress the immune system -medicines that treat cancer -steroid medicines like prednisone or cortisone This list may not describe all possible interactions. Give your health care provider a list of all the medicines, herbs, non-prescription drugs, or dietary supplements you use. Also tell them if you smoke, drink alcohol, or use illegal drugs. Some items may interact with your medicine. What should I watch for while using this medicine? Visit your doctor or health care professional for regular checks on your progress. Your doctor or health care professional may order blood tests and other tests to see how you are doing. Call your doctor or health care professional if you get a cold or other infection while receiving this medicine. Do not treat yourself. This medicine may decrease your body's ability to fight infection. You should make sure you get enough calcium and vitamin D while you are taking this medicine, unless your doctor tells you not to. Discuss the foods you eat and the vitamins you take with your health care professional. See your dentist regularly. Brush and floss your teeth as directed. Before you have any dental work done, tell your dentist you are receiving this medicine. Do not become pregnant while taking this medicine or for 5 months after stopping   it. Women should inform their doctor if they wish to become pregnant or think they might be pregnant. There is a potential for serious side effects to an unborn child. Talk to your health care professional or pharmacist for more  information. What side effects may I notice from receiving this medicine? Side effects that you should report to your doctor or health care professional as soon as possible: -allergic reactions like skin rash, itching or hives, swelling of the face, lips, or tongue -breathing problems -chest pain -fast, irregular heartbeat -feeling faint or lightheaded, falls -fever, chills, or any other sign of infection -muscle spasms, tightening, or twitches -numbness or tingling -skin blisters or bumps, or is dry, peels, or red -slow healing or unexplained pain in the mouth or jaw -unusual bleeding or bruising Side effects that usually do not require medical attention (Report these to your doctor or health care professional if they continue or are bothersome.): -muscle pain -stomach upset, gas This list may not describe all possible side effects. Call your doctor for medical advice about side effects. You may report side effects to FDA at 1-800-FDA-1088. Where should I keep my medicine? This medicine is only given in a clinic, doctor's office, or other health care setting and will not be stored at home. NOTE: This sheet is a summary. It may not cover all possible information. If you have questions about this medicine, talk to your doctor, pharmacist, or health care provider.  2015, Elsevier/Gold Standard. (2011-08-11 12:37:47) Osteoporosis Throughout your life, your body breaks down old bone and replaces it with new bone. As you get older, your body does not replace bone as quickly as it breaks it down. By the age of 30 years, most people begin to gradually lose bone because of the imbalance between bone loss and replacement. Some people lose more bone than others. Bone loss beyond a specified normal degree is considered osteoporosis.  Osteoporosis affects the strength and durability of your bones. The inside of the ends of your bones and your flat bones, like the bones of your pelvis, look like  honeycomb, filled with tiny open spaces. As bone loss occurs, your bones become less dense. This means that the open spaces inside your bones become bigger and the walls between these spaces become thinner. This makes your bones weaker. Bones of a person with osteoporosis can become so weak that they can break (fracture) during minor accidents, such as a simple fall. CAUSES  The following factors have been associated with the development of osteoporosis:  Smoking.  Drinking more than 2 alcoholic drinks several days per week.  Long-term use of certain medicines:  Corticosteroids.  Chemotherapy medicines.  Thyroid medicines.  Antiepileptic medicines.  Gonadal hormone suppression medicine.  Immunosuppression medicine.  Being underweight.  Lack of physical activity.  Lack of exposure to the sun. This can lead to vitamin D deficiency.  Certain medical conditions:  Certain inflammatory bowel diseases, such as Crohn disease and ulcerative colitis.  Diabetes.  Hyperthyroidism.  Hyperparathyroidism. RISK FACTORS Anyone can develop osteoporosis. However, the following factors can increase your risk of developing osteoporosis:  Gender--Women are at higher risk than men.  Age--Being older than 50 years increases your risk.  Ethnicity--White and Asian people have an increased risk.  Weight --Being extremely underweight can increase your risk of osteoporosis.  Family history of osteoporosis--Having a family member who has developed osteoporosis can increase your risk. SYMPTOMS  Usually, people with osteoporosis have no symptoms.  DIAGNOSIS  Signs during a   physical exam that may prompt your caregiver to suspect osteoporosis include:  Decreased height. This is usually caused by the compression of the bones that form your spine (vertebrae) because they have weakened and become fractured.  A curving or rounding of the upper back (kyphosis). To confirm signs of osteoporosis,  your caregiver may request a procedure that uses 2 low-dose X-ray beams with different levels of energy to measure your bone mineral density (dual-energy X-ray absorptiometry [DXA]). Also, your caregiver may check your level of vitamin D. TREATMENT  The goal of osteoporosis treatment is to strengthen bones in order to decrease the risk of bone fractures. There are different types of medicines available to help achieve this goal. Some of these medicines work by slowing the processes of bone loss. Some medicines work by increasing bone density. Treatment also involves making sure that your levels of calcium and vitamin D are adequate. PREVENTION  There are things you can do to help prevent osteoporosis. Adequate intake of calcium and vitamin D can help you achieve optimal bone mineral density. Regular exercise can also help, especially resistance and weight-bearing activities. If you smoke, quitting smoking is an important part of osteoporosis prevention. MAKE SURE YOU:  Understand these instructions.  Will watch your condition.  Will get help right away if you are not doing well or get worse. FOR MORE INFORMATION www.osteo.org and EquipmentWeek[employer].com.ee Document Released: 11/20/2004 Document Revised: 06/07/2012 Document Reviewed: 01/25/2011 [facility] Patient Information 2015 [place], [place]. This information is not intended to replace advice given to you by your health care provider. Make sure you discuss any questions you have with your health care provider.  EXERCISE AND DIET:  We recommended that you start or continue a regular exercise program for good health. Regular exercise means any activity that makes your heart beat faster and makes you sweat.  We recommend exercising at least 30 minutes per day at least 3 days a week, preferably 4 or 5.  We also recommend a diet low in fat and sugar.  Inactivity, poor dietary choices and obesity can cause diabetes, heart attack, stroke, and kidney damage, among  others.    ALCOHOL AND SMOKING:  Women should limit their alcohol intake to no more than 7 drinks/beers/glasses of wine (combined, not each!) per week. Moderation of alcohol intake to this level decreases your risk of breast cancer and liver damage. And of course, no recreational drugs are part of a healthy lifestyle.  And absolutely no smoking or even second hand smoke. Most people know smoking can cause heart and lung diseases, but did you know it also contributes to weakening of your bones? Aging of your skin?  Yellowing of your teeth and nails?  CALCIUM AND VITAMIN D:  Adequate intake of calcium and Vitamin D are recommended.  The recommendations for exact amounts of these supplements seem to change often, but generally speaking 600 mg of calcium (either carbonate or citrate) and 800 units of Vitamin D per day seems prudent. Certain women may benefit from higher intake of Vitamin D.  If you are among these women, your doctor will have told you during your visit.    PAP SMEARS:  Pap smears, to check for cervical cancer or precancers,  have traditionally been done yearly, although recent scientific advances have shown that most women can have pap smears less often.  However, every woman still should have a physical exam from her gynecologist every year. It will include a breast check, inspection of the vulva and vagina  to check for abnormal growths or skin changes, a visual exam of the cervix, and then an exam to evaluate the size and shape of the uterus and ovaries.  And after 74 years of age, a rectal exam is indicated to check for rectal cancers. We will also provide age appropriate advice regarding health maintenance, like when you should have certain vaccines, screening for sexually transmitted diseases, bone density testing, colonoscopy, mammograms, etc.   MAMMOGRAMS:  All women over 67 years old should have a yearly mammogram. Many facilities now offer a "3D" mammogram, which may cost around $50  extra out of pocket. If possible,  we recommend you accept the option to have the 3D mammogram performed.  It both reduces the number of women who will be called back for extra views which then turn out to be normal, and it is better than the routine mammogram at detecting truly abnormal areas.    COLONOSCOPY:  Colonoscopy to screen for colon cancer is recommended for all women at age 68.  We know, you hate the idea of the prep.  We agree, BUT, having colon cancer and not knowing it is worse!!  Colon cancer so often starts as a polyp that can be seen and removed at colonscopy, which can quite literally save your life!  And if your first colonoscopy is normal and you have no family history of colon cancer, most women don't have to have it again for 10 years.  Once every ten years, you can do something that may end up saving your life, right?  We will be happy to help you get it scheduled when you are ready.  Be sure to check your insurance coverage so you understand how much it will cost.  It may be covered as a preventative service at no cost, but you should check your particular policy.

## 2014-11-09 LAB — VITAMIN D 25 HYDROXY (VIT D DEFICIENCY, FRACTURES): VIT D 25 HYDROXY: 29 ng/mL — AB (ref 30–100)

## 2014-11-15 ENCOUNTER — Telehealth: Payer: Self-pay | Admitting: *Deleted

## 2014-11-15 NOTE — Telephone Encounter (Signed)
LMTC in regards to lab results -eh 

## 2014-11-15 NOTE — Telephone Encounter (Signed)
-----   Message from Heidi Dom, MD sent at 11/14/2014  6:11 PM EDT ----- Please inform the patient that her vit d level is low, the rest of her blood work is normal. She should be taking 1,000 IU a day of vit d (over whatever supplements she is taking now).

## 2014-11-16 NOTE — Telephone Encounter (Signed)
Patient is returning your call, patient asked if you could call her at work [phone].

## 2014-11-16 NOTE — Telephone Encounter (Signed)
I called patient at work number and spoke with her and gave her lab results. -eh

## 2015-03-09 ENCOUNTER — Other Ambulatory Visit: Payer: Self-pay | Admitting: Obstetrics and Gynecology

## 2015-03-09 DIAGNOSIS — R921 Mammographic calcification found on diagnostic imaging of breast: Secondary | ICD-10-CM

## 2015-04-11 ENCOUNTER — Ambulatory Visit
Admission: RE | Admit: 2015-04-11 | Discharge: 2015-04-11 | Disposition: A | Discharge status: Home / Self Care | Payer: BC Managed Care – PPO | Source: Ambulatory Visit | Attending: Obstetrics and Gynecology | Admitting: Obstetrics and Gynecology

## 2015-04-11 DIAGNOSIS — R921 Mammographic calcification found on diagnostic imaging of breast: Secondary | ICD-10-CM

## 2015-04-20 ENCOUNTER — Encounter: Payer: Self-pay | Admitting: Diagnostic Neuroimaging

## 2015-04-20 ENCOUNTER — Ambulatory Visit (INDEPENDENT_AMBULATORY_CARE_PROVIDER_SITE_OTHER): Payer: BC Managed Care – PPO | Admitting: Diagnostic Neuroimaging

## 2015-04-20 VITALS — BP 159/87 | HR 57 | Ht 66.0 in | Wt 122.2 lb

## 2015-04-20 DIAGNOSIS — R55 Syncope and collapse: Secondary | ICD-10-CM

## 2015-04-20 DIAGNOSIS — R569 Unspecified convulsions: Secondary | ICD-10-CM

## 2015-04-20 MED ORDER — LEVETIRACETAM 500 MG PO TABS: 500.0000 mg | ORAL_TABLET | Freq: Two times a day (BID) | ORAL | Status: DC

## 2015-04-20 NOTE — Patient Instructions (Signed)
Thank you for coming to see [place] at [facility]. I hope we have been able to provide you high quality care today.  You may receive a patient satisfaction survey over the next few weeks. We would appreciate your feedback and comments so that we may continue to improve ourselves and the health of our patients.  - I will start levetiracetam 529m twice a day  - I will check EEG (brain wave test)  - I will refer you to cardiology appointment  - According to [place], you can not drive unless you are without seizure or passing out for at least 6 months and under physician's care.   - Please maintain seizure precautions. Do not participate in activities where a loss of awareness could harm you or someone else. No swimming alone, no tub bathing, no hot tubs, no driving, no operating motorized vehicles (cars, ATVs, motocycles, etc), lawnmowers or power tools. No standing at heights, such as rooftops, ladders or stairs. Avoid hot objects such as stoves, heaters, open fires. Wear a helmet when riding a bicycle, scooter, skateboard, etc. and avoid areas of traffic. Set your water heater to 120 degrees or less.    ~~~~~~~~~~~~~~~~~~~~~~~~~~~~~~~~~~~~~~~~~~~~~~~~~~~~~~~~~~~~~~~~~  DR. PENUMALLI'S GUIDE TO HAPPY AND HEALTHY LIVING These are some of my general health and wellness recommendations. Some of them may apply to you better than others. Please use common sense as you try these suggestions and feel free to ask me any questions.   ACTIVITY/FITNESS Mental, social, emotional and physical stimulation are very important for brain and body health. Try learning a new activity (arts, music, language, sports, games).  Keep moving your body to the best of your abilities. You can do this at home, inside or outside, the park, community center, gym or anywhere you like. Consider a physical therapist or personal trainer to get started. Consider the app Sworkit. Fitness trackers such as  smart-watches, smart-phones or Fitbits can help as well.   NUTRITION Eat more plants: colorful vegetables, nuts, seeds and berries.  Eat less sugar, salt, preservatives and processed foods.  Avoid toxins such as cigarettes and alcohol.  Drink water when you are thirsty. Warm water with a slice of lemon is an excellent morning drink to start the day.  Consider these websites for more information The Nutrition Source (hhttps://www.henry-hernandez.biz/ Precision Nutrition (wWindowBlog.ch   RELAXATION Consider practicing mindfulness meditation or other relaxation techniques such as deep breathing, prayer, yoga, tai chi, massage. See website mindful.org or the apps Headspace or Calm to help get started.   SLEEP Try to get at least 7-8+ hours sleep per day. Regular exercise and reduced caffeine will help you sleep better. Practice good sleep hygeine techniques. See website sleep.org for more information.   PLANNING Prepare estate planning, living will, healthcare POA documents. Sometimes this is best planned with the help of an attorney. Theconversationproject.org and agingwithdignity.org are excellent resources.
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
## 2015-05-02 ENCOUNTER — Telehealth: Payer: Self-pay | Admitting: Diagnostic Neuroimaging

## 2015-05-02 NOTE — Telephone Encounter (Signed)
New Message  This message is to inform you that we have made 3 consecutive attempts to contact the patient. We have also mailed a letter to the patient to inform them to call in and schedule. Although we were unsuccessful in these attempts we wanted you to be aware of our efforts. Will remove the patient from our work queue at this time.   [facility]

## 2015-05-18 ENCOUNTER — Ambulatory Visit (INDEPENDENT_AMBULATORY_CARE_PROVIDER_SITE_OTHER): Payer: BC Managed Care – PPO | Admitting: Diagnostic Neuroimaging

## 2015-05-18 DIAGNOSIS — R569 Unspecified convulsions: Secondary | ICD-10-CM

## 2015-05-18 DIAGNOSIS — R55 Syncope and collapse: Secondary | ICD-10-CM

## 2015-05-18 NOTE — Procedures (Signed)
   [facility]  EEG (ELECTROENCEPHALOGRAM) REPORT   STUDY DATE: 05/18/15 PATIENT NAME: Heidi Vasquez DOB: [birth year] MRN: [id number]  ORDERING CLINICIAN: Andrey Spearman, MD   TECHNOLOGIST: Laretta Alstrom  TECHNIQUE: Electroencephalogram was recorded utilizing standard 10-20 system of lead placement and reformatted into average and bipolar montages.  RECORDING TIME: 30 minutes  ACTIVATION: hyperventilation and photic stimulation  CLINICAL INFORMATION: 75 year old female with syncope vs seizure.  FINDINGS: Background rhythms of 12-13 hertz and 20-30 microvolts. No focal, lateralizing, epileptiform activity or seizures are seen. Patient recorded in the awake and drowsy state.    IMPRESSION:  Normal EEG in the awake and drowsy states.     INTERPRETING PHYSICIAN:  Penni Bombard, MD Certified in Neurology, Neurophysiology and Neuroimaging  [facility] [address] [place],  [address] [phone]

## 2015-06-06 ENCOUNTER — Ambulatory Visit: Payer: BC Managed Care – PPO | Admitting: Diagnostic Neuroimaging

## 2015-06-15 ENCOUNTER — Encounter: Payer: Self-pay | Admitting: Diagnostic Neuroimaging

## 2015-06-15 ENCOUNTER — Ambulatory Visit (INDEPENDENT_AMBULATORY_CARE_PROVIDER_SITE_OTHER): Payer: BC Managed Care – PPO | Admitting: Diagnostic Neuroimaging

## 2015-06-15 VITALS — BP 136/79 | HR 80 | Ht 66.0 in | Wt 123.0 lb

## 2015-06-15 DIAGNOSIS — R569 Unspecified convulsions: Secondary | ICD-10-CM

## 2015-06-15 DIAGNOSIS — R55 Syncope and collapse: Secondary | ICD-10-CM | POA: Diagnosis not present

## 2015-06-15 MED ORDER — LEVETIRACETAM 500 MG PO TABS: 500.0000 mg | ORAL_TABLET | Freq: Two times a day (BID) | ORAL | Status: DC
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
## 2015-06-15 NOTE — Patient Instructions (Signed)
-   continue levetiracetam. - follow up with cardiology.

## 2015-09-10 ENCOUNTER — Telehealth: Payer: Self-pay | Admitting: Obstetrics and Gynecology

## 2015-09-10 ENCOUNTER — Other Ambulatory Visit: Payer: Self-pay | Admitting: Obstetrics and Gynecology

## 2015-09-10 DIAGNOSIS — R921 Mammographic calcification found on diagnostic imaging of breast: Secondary | ICD-10-CM

## 2015-09-10 DIAGNOSIS — Z8739 Personal history of other diseases of the musculoskeletal system and connective tissue: Secondary | ICD-10-CM

## 2015-09-10 NOTE — Telephone Encounter (Signed)
Patient called and requested an order for a bone density be faxed to [facility] for her 10/19/15 appointment.

## 2015-09-10 NOTE — Telephone Encounter (Signed)
Left detailed message at number provided [phone], okay per ROI notifying patient that an order for her BMD has been placed. [facility] will need to [facility] to [facility]. Advised to contact the office with any further questions.  Routing to provider for final review. Patient agreeable to disposition. Will close encounter.

## 2015-09-13 ENCOUNTER — Encounter (INDEPENDENT_AMBULATORY_CARE_PROVIDER_SITE_OTHER): Payer: Self-pay

## 2015-09-13 ENCOUNTER — Ambulatory Visit (INDEPENDENT_AMBULATORY_CARE_PROVIDER_SITE_OTHER): Payer: BC Managed Care – PPO | Admitting: Cardiovascular Disease

## 2015-09-13 ENCOUNTER — Encounter: Payer: Self-pay | Admitting: Cardiovascular Disease

## 2015-09-13 VITALS — BP 147/81 | HR 84 | Ht 65.5 in | Wt 123.0 lb

## 2015-09-13 DIAGNOSIS — R55 Syncope and collapse: Secondary | ICD-10-CM | POA: Insufficient documentation

## 2015-09-13 NOTE — Patient Instructions (Signed)
Medication Instructions:  Your physician recommends that you continue on your current medications as directed. Please refer to the Current Medication list given to you today.   Labwork: None Ordered   Testing/Procedures: None Ordered   Follow-Up: Your physician recommends that you schedule a follow-up appointment in: as needed with Dr. Nahser   If you need a refill on your cardiac medications before your next appointment, please call your pharmacy.   Thank you for choosing [facility]! Vidalia Serpas, RN [phone]    

## 2015-09-13 NOTE — Progress Notes (Signed)
Cardiology Office Note   Date:  09/13/2015   ID:  Heidi, Vasquez [birth year], MRN [id number]  PCP:  Heidi Savage, MD  Cardiologist:   Mertie Moores, MD   Chief Complaint  Patient presents with  . Loss of Consciousness   Problem list 1. History of syncope 2. COPD    History of Present Illness: Heidi Vasquez is a 75 y.o. female who presents for further evaluation of syncope. She had 3 separate episodes of loss of consciousness that all occurred during a 2 day period.  ( Feb. 2-3, 2017 Sat. AM, Sat,PM, Sunday am) .      She had donated blood 7 days prior.    Has not had any further episodes of syncope Walks 2 miles a day 5 days a week. (walks 17 minute miles )  No CP or dyspnea while walking  No syncope .   Watches her diet  Still works at the [employer]    Past Medical History  Diagnosis Date  . Allergic rhinitis   . MVP (mitral valve prolapse)   . Osteopenia     Intolerant of Fosamax  . Diverticulosis     Past Surgical History  Procedure Laterality Date  . Tubal ligation       Current Outpatient Prescriptions  Medication Sig Dispense Refill  . aspirin EC 81 MG tablet Take 81 mg by mouth daily.    Marland Kitchen levETIRAcetam (KEPPRA) 500 MG tablet Take 1 tablet (500 mg total) by mouth 2 (two) times daily. 180 tablet 4  . Multiple Vitamins-Minerals (MULTIVITAMIN PO) Take by mouth daily as needed (for supplement).      No current facility-administered medications for this visit.    Allergies:   Review of patient's allergies indicates no known allergies.    Social History:  The patient  reports that she has never smoked. She has never used smokeless tobacco. She reports that she does not drink alcohol or use illicit drugs.   Family History:  The patient's family history includes Breast cancer in her sister; Colon cancer in her father; Diabetes in her mother; Hypertension in her mother; Stroke in her mother.    ROS:  Please see the history of  present illness.    Review of Systems: Constitutional:  denies fever, chills, diaphoresis, appetite change and fatigue.  HEENT: denies photophobia, eye pain, redness, hearing loss, ear pain, congestion, sore throat, rhinorrhea, sneezing, neck pain, neck stiffness and tinnitus.  Respiratory: denies SOB, DOE, cough, chest tightness, and wheezing.  Cardiovascular: denies chest pain, palpitations and leg swelling.  Gastrointestinal: denies nausea, vomiting, abdominal pain, diarrhea, constipation, blood in stool.  Genitourinary: denies dysuria, urgency, frequency, hematuria, flank pain and difficulty urinating.  Musculoskeletal: denies  myalgias, back pain, joint swelling, arthralgias and gait problem.   Skin: denies pallor, rash and wound.  Neurological: denies dizziness, seizures, syncope, weakness, light-headedness, numbness and headaches.   Hematological: denies adenopathy, easy bruising, personal or family bleeding history.  Psychiatric/ Behavioral: denies suicidal ideation, mood changes, confusion, nervousness, sleep disturbance and agitation.       All other systems are reviewed and negative.    PHYSICAL EXAM: VS:  BP 147/81 mmHg  Pulse 84  Ht 5' 5.5" (1.664 m)  Wt 123 lb (55.792 kg)  BMI 20.15 kg/m2  SpO2 97%  LMP 02/25/1992 , BMI Body mass index is 20.15 kg/(m^2). GEN: Well nourished, well developed, in no acute distress HEENT: normal Neck: no JVD, carotid bruits, or masses Cardiac:  RRR; very soft systolic  murmur, rubs, or gallops,no edema  Respiratory:  clear to auscultation bilaterally, normal work of breathing GI: soft, nontender, nondistended, + BS MS: no deformity or atrophy Skin: warm and dry, no rash Neuro:  Strength and sensation are intact Psych: normal   EKG:  EKG is ordered today. The ekg ordered today demonstrates  NSR at 84 with premature junctional beat    Recent Labs: 11/08/2014: ALT 21; BUN 12; Creat 0.83; Hemoglobin 13.7; Platelets 327; Potassium  4.6; Sodium 140    Lipid Panel    Component Value Date/Time   CHOL 175 10/20/2012 1404   TRIG 129 10/20/2012 1404   HDL 68 10/20/2012 1404   CHOLHDL 2.6 10/20/2012 1404   VLDL 26 10/20/2012 1404   LDLCALC 81 10/20/2012 1404      Wt Readings from Last 3 Encounters:  09/13/15 123 lb (55.792 kg)  06/15/15 123 lb (55.792 kg)  04/20/15 122 lb 3.2 oz (55.43 kg)      Other studies Reviewed: Additional studies/ records that were reviewed today include: . Review of the above records demonstrates:    ASSESSMENT AND PLAN:  1.  History of syncope: [name] presents today for evaluation of 3 episodes of passing out that occurred back in February. She's not had any recurrent episodes. She's extremely healthy. She walks 2 miles a day 5 days a week. She walks at a 17 minute mile pace. She's never had any episodes of chest pain or shortness breath or syncopal he with exercise.  She's not had any recurrent episodes in the past 5 months. I do not think that placing a monitor on her which show [place] anything. Her cardiac exam is essentially benign. She has a very soft systolic murmur but it does not sound significant at all.  At this point would be very conservative. I have advised her to call me she has any recurrent episodes of syncope. Otherwise, she'll follow-up with her medical doctor.   Current medicines are reviewed at length with the patient today.  The patient does not have concerns regarding medicines.  Labs/ tests ordered today include:  No orders of the defined types were placed in this encounter.    Disposition:   FU with me as needed.     Mertie Moores, MD  09/13/2015 2:52 PM    [facility], [place], [place]  [address] Phone: [phone]; Fax: [phone]

## 2015-10-19 ENCOUNTER — Other Ambulatory Visit: Payer: Self-pay | Admitting: Obstetrics and Gynecology

## 2015-10-19 ENCOUNTER — Ambulatory Visit
Admission: RE | Admit: 2015-10-19 | Discharge: 2015-10-19 | Disposition: A | Discharge status: Home / Self Care | Payer: BC Managed Care – PPO | Source: Ambulatory Visit | Attending: Obstetrics and Gynecology | Admitting: Obstetrics and Gynecology

## 2015-10-19 DIAGNOSIS — M810 Age-related osteoporosis without current pathological fracture: Secondary | ICD-10-CM

## 2015-10-19 DIAGNOSIS — R921 Mammographic calcification found on diagnostic imaging of breast: Secondary | ICD-10-CM

## 2015-10-19 DIAGNOSIS — Z8739 Personal history of other diseases of the musculoskeletal system and connective tissue: Secondary | ICD-10-CM

## 2015-10-22 ENCOUNTER — Telehealth: Payer: Self-pay | Admitting: *Deleted

## 2015-10-22 NOTE — Telephone Encounter (Signed)
Left message to call regarding DEXA results-eh   

## 2015-10-22 NOTE — Telephone Encounter (Signed)
-----   Message from Salvadore Dom, MD sent at 10/20/2015  8:19 AM EDT ----- Please inform the patient that her DEXA shows osteopenia, but she does have an elevated risk of a hip fracture and medication is recommended. She actually had improvement in her bone density in her spine and left hip. Her lowest T score is in her right hip and I can't find a comparison to the right hip from her last DEXA (not in the report).  Her current 10 year risk of a hip fracture is 4.5%. Treatment is recommended for anything over 3%. I know she declined treatment in the past. She should continue calcium, vit d, weight bearing exercise. Please offer her an appointment to discuss.

## 2015-10-23 NOTE — Telephone Encounter (Signed)
Patient returned call. Please call back at work number designated above.

## 2015-10-23 NOTE — Telephone Encounter (Signed)
Spoke with patient and went over results in detailed. Expressed that treatment is recommended. She wants to think about rather or not she wants to start treatment between now and her next appointment which is 11-12-15. She wishes to discuss it more then. -eh

## 2015-11-12 ENCOUNTER — Encounter: Payer: Self-pay | Admitting: Obstetrics and Gynecology

## 2015-11-12 ENCOUNTER — Ambulatory Visit (INDEPENDENT_AMBULATORY_CARE_PROVIDER_SITE_OTHER): Payer: BC Managed Care – PPO | Admitting: Obstetrics and Gynecology

## 2015-11-12 VITALS — BP 112/70 | HR 84 | Resp 14 | Ht 64.5 in | Wt 125.0 lb

## 2015-11-12 DIAGNOSIS — Z01419 Encounter for gynecological examination (general) (routine) without abnormal findings: Secondary | ICD-10-CM

## 2015-11-12 DIAGNOSIS — Z124 Encounter for screening for malignant neoplasm of cervix: Secondary | ICD-10-CM | POA: Diagnosis not present

## 2015-11-12 DIAGNOSIS — Z0000 Encounter for general adult medical examination without abnormal findings: Secondary | ICD-10-CM | POA: Diagnosis not present

## 2015-11-12 LAB — CBC
HEMATOCRIT: 39.9 % (ref 35.0–45.0)
Hemoglobin: 13.3 g/dL (ref 11.7–15.5)
MCH: 30.4 pg (ref 27.0–33.0)
MCHC: 33.3 g/dL (ref 32.0–36.0)
MCV: 91.3 fL (ref 80.0–100.0)
MPV: 10.5 fL (ref 7.5–12.5)
Platelets: 316 10*3/uL (ref 140–400)
RBC: 4.37 MIL/uL (ref 3.80–5.10)
RDW: 13.2 % (ref 11.0–15.0)
WBC: 6.8 10*3/uL (ref 3.8–10.8)

## 2015-11-12 LAB — TSH: TSH: 1.93 m[IU]/L

## 2015-11-12 NOTE — Progress Notes (Signed)
75 y.o. G2P2 DivorcedCaucasianF here for annual exam.  No vaginal bleeding.     Patient's last menstrual period was 02/25/1992.          Sexually active: No.  The current method of family planning is post menopausal status.    Exercising: Yes.    walking and weight bearing Smoker:  no  Health Maintenance: Pap:  07-27-09 WNL  History of abnormal Pap:  no MMG:  10-19-15 Stable probably benign right breast calcifications DX mammo in 1 year Colonoscopy:  2015 repeat in 5 years BMD:   10-19-15 Osteopenic, Frax 13,9% of any fracture, 4.5% of a hip fracture. In the past she was on Fosamax x 6 months. Coughed up blood x1 at 6 months and stopped it. This was years ago.  TDaP:  10-18-13 Gardasil: N/A   reports that she has never smoked. She has never used smokeless tobacco. She reports that she does not drink alcohol or use drugs. Taking care of her grand kids. She has 3 grandchildren.  Past Medical History:  Diagnosis Date  . Allergic rhinitis   . Diverticulosis   . MVP (mitral valve prolapse)   . Osteopenia    Intolerant of Fosamax    Past Surgical History:  Procedure Laterality Date  . TUBAL LIGATION      Current Outpatient Prescriptions  Medication Sig Dispense Refill  . aspirin EC 81 MG tablet Take 81 mg by mouth daily.    Marland Kitchen levETIRAcetam (KEPPRA) 500 MG tablet Take 1 tablet (500 mg total) by mouth 2 (two) times daily. 180 tablet 4  . Multiple Vitamins-Minerals (MULTIVITAMIN PO) Take by mouth daily as needed (for supplement).      No current facility-administered medications for this visit.     Family History  Problem Relation Age of Onset  . Stroke Mother   . Diabetes Mother   . Hypertension Mother   . Colon cancer Father   . Breast cancer Sister     Review of Systems  Constitutional: Negative.   HENT: Negative.   Eyes: Negative.   Respiratory: Negative.   Cardiovascular: Negative.   Gastrointestinal: Negative.   Endocrine: Negative.   Genitourinary: Negative.    Musculoskeletal: Negative.   Skin: Negative.   Allergic/Immunologic: Negative.   Neurological: Negative.   Psychiatric/Behavioral: Negative.     Exam:   BP 112/70 (BP Location: Right Arm, Patient Position: Sitting, Cuff Size: Normal)   Pulse 84   Resp 14   Ht 5' 4.5" (1.638 m)   Wt 125 lb (56.7 kg)   LMP 02/25/1992   BMI 21.12 kg/m   Weight change: @WEIGHTCHANGE @ Height:   Height: 5' 4.5" (163.8 cm)  Ht Readings from Last 3 Encounters:  11/12/15 5' 4.5" (1.638 m)  09/13/15 5' 5.5" (1.664 m)  06/15/15 5\' 6"  (1.676 m)    General appearance: alert, cooperative and appears stated age Head: Normocephalic, without obvious abnormality, atraumatic Neck: no adenopathy, supple, symmetrical, trachea midline and thyroid normal to inspection and palpation Lungs: clear to auscultation bilaterally Breasts: normal appearance, no masses or tenderness Heart: regular rate and rhythm Abdomen: soft, non-tender; bowel sounds normal; no masses,  no organomegaly Extremities: extremities normal, atraumatic, no cyanosis or edema Skin: Skin color, texture, turgor normal. No rashes or lesions Lymph nodes: Cervical, supraclavicular, and axillary nodes normal. No abnormal inguinal nodes palpated Neurologic: Grossly normal   Pelvic: External genitalia:  no lesions              Urethra:  normal appearing  urethra with no masses, tenderness or lesions              Bartholins and Skenes: normal                 Vagina: normal appearing atrophic vagina with normal color and discharge, no lesions              Cervix: no lesions               Bimanual Exam:  Uterus:  normal size, contour, position, consistency, mobility, non-tender              Adnexa: no mass, fullness, tenderness               Rectovaginal: Confirms               Anus:  normal sphincter tone, no lesions  Chaperone was present for exam.  A:  Well Woman with normal exam  Osteopenia with a 4.5% risk of hip fracture  P:   We discussed  recommendation for treatment  Recommended she get 1,200 mg a day of calcium, we will check her vit D level  Continue exercise.   Mammogram and colonoscopy are UTD  Discussed breast self awareness  Pap with hpv  Screening labs

## 2015-11-12 NOTE — Patient Instructions (Signed)
EXERCISE AND DIET:  We recommended that you start or continue a regular exercise program for good health. Regular exercise means any activity that makes your heart beat faster and makes you sweat.  We recommend exercising at least 30 minutes per day at least 3 days a week, preferably 4 or 5.  We also recommend a diet low in fat and sugar.  Inactivity, poor dietary choices and obesity can cause diabetes, heart attack, stroke, and kidney damage, among others.    ALCOHOL AND SMOKING:  Women should limit their alcohol intake to no more than 7 drinks/beers/glasses of wine (combined, not each!) per week. Moderation of alcohol intake to this level decreases your risk of breast cancer and liver damage. And of course, no recreational drugs are part of a healthy lifestyle.  And absolutely no smoking or even second hand smoke. Most people know smoking can cause heart and lung diseases, but did you know it also contributes to weakening of your bones? Aging of your skin?  Yellowing of your teeth and nails?  CALCIUM AND VITAMIN D:  Adequate intake of calcium and Vitamin D are recommended.  The recommendations for exact amounts of these supplements seem to change often, but generally speaking 600 mg of calcium (either carbonate or citrate) and 800 units of Vitamin D per day seems prudent. Certain women may benefit from higher intake of Vitamin D.  If you are among these women, your doctor will have told you during your visit.    PAP SMEARS:  Pap smears, to check for cervical cancer or precancers,  have traditionally been done yearly, although recent scientific advances have shown that most women can have pap smears less often.  However, every woman still should have a physical exam from her gynecologist every year. It will include a breast check, inspection of the vulva and vagina to check for abnormal growths or skin changes, a visual exam of the cervix, and then an exam to evaluate the size and shape of the uterus and  ovaries.  And after 75 years of age, a rectal exam is indicated to check for rectal cancers. We will also provide age appropriate advice regarding health maintenance, like when you should have certain vaccines, screening for sexually transmitted diseases, bone density testing, colonoscopy, mammograms, etc.   MAMMOGRAMS:  All women over 40 years old should have a yearly mammogram. Many facilities now offer a "3D" mammogram, which may cost around $50 extra out of pocket. If possible,  we recommend you accept the option to have the 3D mammogram performed.  It both reduces the number of women who will be called back for extra views which then turn out to be normal, and it is better than the routine mammogram at detecting truly abnormal areas.    COLONOSCOPY:  Colonoscopy to screen for colon cancer is recommended for all women at age 50.  We know, you hate the idea of the prep.  We agree, BUT, having colon cancer and not knowing it is worse!!  Colon cancer so often starts as a polyp that can be seen and removed at colonscopy, which can quite literally save your life!  And if your first colonoscopy is normal and you have no family history of colon cancer, most women don't have to have it again for 10 years.  Once every ten years, you can do something that may end up saving your life, right?  We will be happy to help you get it scheduled when you are ready.    Be sure to check your insurance coverage so you understand how much it will cost.  It may be covered as a preventative service at no cost, but you should check your particular policy.      Osteoporosis Osteoporosis is the thinning and loss of density in the bones. Osteoporosis makes the bones more brittle, fragile, and likely to break (fracture). Over time, osteoporosis can cause the bones to become so weak that they fracture after a simple fall. The bones most likely to fracture are the bones in the hip, wrist, and spine. CAUSES  The exact cause is not  known. RISK FACTORS Anyone can develop osteoporosis. You may be at greater risk if you have a family history of the condition or have poor nutrition. You may also have a higher risk if you are:   Female.   18 years old or older.  A smoker.  Not physically active.   White or Asian.  Slender. SIGNS AND SYMPTOMS  A fracture might be the first sign of the disease, especially if it results from a fall or injury that would not usually cause a bone to break. Other signs and symptoms include:   Low back and neck pain.  Stooped posture.  Height loss. DIAGNOSIS  To make a diagnosis, your health care provider may:  Take a medical history.  Perform a physical exam.  Order tests, such as:  A bone mineral density test.  A dual-energy X-ray absorptiometry test. TREATMENT  The goal of osteoporosis treatment is to strengthen your bones to reduce your risk of a fracture. Treatment may involve:  Making lifestyle changes, such as:  Eating a diet rich in calcium.  Doing weight-bearing and muscle-strengthening exercises.  Stopping tobacco use.  Limiting alcohol intake.  Taking medicine to slow the process of bone loss or to increase bone density.  Monitoring your levels of calcium and vitamin D. HOME CARE INSTRUCTIONS  Include calcium and vitamin D in your diet. Calcium is important for bone health, and vitamin D helps the body absorb calcium.  Perform weight-bearing and muscle-strengthening exercises as directed by your health care provider.  Do not use any tobacco products, including cigarettes, chewing tobacco, and electronic cigarettes. If you need help quitting, ask your health care provider.  Limit your alcohol intake.  Take medicines only as directed by your health care provider.  Keep all follow-up visits as directed by your health care provider. This is important.  Take precautions at home to lower your risk of falling, such as:  Keeping rooms well lit and  clutter free.  Installing safety rails on stairs.  Using rubber mats in the bathroom and other areas that are often wet or slippery. SEEK IMMEDIATE MEDICAL CARE IF:  You fall or injure yourself.    This information is not intended to replace advice given to you by your health care provider. Make sure you discuss any questions you have with your health care provider.   Document Released: 11/20/2004 Document Revised: 03/03/2014 Document Reviewed: 07/21/2013 Elsevier Interactive Patient Education [employer].

## 2015-11-13 ENCOUNTER — Telehealth: Payer: Self-pay | Admitting: *Deleted

## 2015-11-13 LAB — LIPID PANEL
CHOL/HDL RATIO: 2.4 ratio (ref <=5.0)
Cholesterol: 183 mg/dL (ref 125–200)
HDL: 75 mg/dL (ref >=46)
LDL Cholesterol: 64 mg/dL (ref <130)
Triglycerides: 222 mg/dL — ABNORMAL HIGH (ref <150)
VLDL: 44 mg/dL — ABNORMAL HIGH (ref <30)

## 2015-11-13 LAB — COMPREHENSIVE METABOLIC PANEL
ALK PHOS: 83 U/L (ref 33–130)
ALT: 11 U/L (ref 6–29)
AST: 18 U/L (ref 10–35)
Albumin: 4.2 g/dL (ref 3.6–5.1)
BUN: 13 mg/dL (ref 7–25)
CALCIUM: 9.7 mg/dL (ref 8.6–10.4)
CO2: 30 mmol/L (ref 20–31)
Chloride: 101 mmol/L (ref 98–110)
Creat: 0.79 mg/dL (ref 0.60–0.93)
GLUCOSE: 86 mg/dL (ref 65–99)
POTASSIUM: 4.5 mmol/L (ref 3.5–5.3)
Sodium: 140 mmol/L (ref 135–146)
Total Bilirubin: 0.6 mg/dL (ref 0.2–1.2)
Total Protein: 7 g/dL (ref 6.1–8.1)

## 2015-11-13 LAB — VITAMIN D 25 HYDROXY (VIT D DEFICIENCY, FRACTURES): VIT D 25 HYDROXY: 34 ng/mL (ref 30–100)

## 2015-11-13 NOTE — Telephone Encounter (Signed)
Left message to call regarding lab results -eh 

## 2015-11-13 NOTE — Telephone Encounter (Signed)
-----   Message from Salvadore Dom, MD sent at 11/13/2015 12:41 PM EDT ----- Please inform the patient that her vit d was in the low normal range. I would make sure she is getting an additional 800 IU of vit d 3 daily. Her triglycerides were elevated, likely because she wasn't fasting. She should come back for a fasting lipid panel sometime in the next few months. Please set this up.

## 2015-11-14 LAB — IPS PAP TEST WITH HPV

## 2015-11-14 NOTE — Telephone Encounter (Signed)
Spoke with patient and gave results -eh  

## 2015-11-14 NOTE — Telephone Encounter (Addendum)
Patient left message on answering machine returning call. Ok to call work number [phone].

## 2015-11-15 ENCOUNTER — Ambulatory Visit: Payer: BC Managed Care – PPO | Admitting: Obstetrics and Gynecology

## 2015-12-18 ENCOUNTER — Ambulatory Visit: Payer: BC Managed Care – PPO | Admitting: Diagnostic Neuroimaging

## 2015-12-19 ENCOUNTER — Ambulatory Visit (INDEPENDENT_AMBULATORY_CARE_PROVIDER_SITE_OTHER): Payer: BC Managed Care – PPO | Admitting: Diagnostic Neuroimaging

## 2015-12-19 ENCOUNTER — Encounter: Payer: Self-pay | Admitting: Diagnostic Neuroimaging

## 2015-12-19 VITALS — BP 110/74 | HR 80 | Wt 125.0 lb

## 2015-12-19 DIAGNOSIS — R55 Syncope and collapse: Secondary | ICD-10-CM | POA: Diagnosis not present

## 2015-12-19 DIAGNOSIS — R569 Unspecified convulsions: Secondary | ICD-10-CM | POA: Diagnosis not present

## 2015-12-19 MED ORDER — LEVETIRACETAM 500 MG PO TABS: 500.0000 mg | ORAL_TABLET | Freq: Two times a day (BID) | ORAL | 4 refills | Status: DC
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
## 2016-09-09 ENCOUNTER — Other Ambulatory Visit: Payer: Self-pay | Admitting: Obstetrics and Gynecology

## 2016-09-09 DIAGNOSIS — R921 Mammographic calcification found on diagnostic imaging of breast: Secondary | ICD-10-CM

## 2016-10-31 ENCOUNTER — Ambulatory Visit
Admission: RE | Admit: 2016-10-31 | Discharge: 2016-10-31 | Disposition: A | Discharge status: Home / Self Care | Payer: BC Managed Care – PPO | Source: Ambulatory Visit | Attending: Obstetrics and Gynecology | Admitting: Obstetrics and Gynecology

## 2016-10-31 DIAGNOSIS — R921 Mammographic calcification found on diagnostic imaging of breast: Secondary | ICD-10-CM

## 2016-11-13 ENCOUNTER — Ambulatory Visit: Payer: BC Managed Care – PPO | Admitting: Obstetrics and Gynecology

## 2016-11-20 ENCOUNTER — Encounter: Payer: Self-pay | Admitting: Obstetrics and Gynecology

## 2016-11-20 ENCOUNTER — Ambulatory Visit (INDEPENDENT_AMBULATORY_CARE_PROVIDER_SITE_OTHER): Payer: BC Managed Care – PPO | Admitting: Obstetrics and Gynecology

## 2016-11-20 VITALS — BP 138/70 | HR 76 | Resp 16 | Ht 64.75 in | Wt 124.0 lb

## 2016-11-20 DIAGNOSIS — Z0000 Encounter for general adult medical examination without abnormal findings: Secondary | ICD-10-CM

## 2016-11-20 DIAGNOSIS — Z01419 Encounter for gynecological examination (general) (routine) without abnormal findings: Secondary | ICD-10-CM

## 2016-11-20 DIAGNOSIS — E559 Vitamin D deficiency, unspecified: Secondary | ICD-10-CM

## 2016-11-20 NOTE — Patient Instructions (Signed)

## 2016-11-20 NOTE — Progress Notes (Signed)
76 y.o. G2P2 DivorcedCaucasianF here for annual exam.  No vaginal bleeding.     Patient's last menstrual period was 02/25/1992.          Sexually active: No.  The current method of family planning is post menopausal status.    Exercising: Yes.    walking, weight training Smoker:  no  Health Maintenance: Pap:  11/12/15 Neg. HR HPV:neg   07/27/09 Normal  History of abnormal Pap:  no MMG:  10/31/16 BIRADS2:Benign  Colonoscopy:  2015 f/u 5 years  BMD:   10/19/15 Osteopenia,  Frax 13,9% of any fracture, 4.5% of a hip fracture. In the past she was on Fosamax x 6 months. Coughed up blood x1 at 6 months and stopped it. This was years ago.  TDaP:  10/18/13    reports that she has never smoked. She has never used smokeless tobacco. She reports that she does not drink alcohol or use drugs. She has 2 kids and 3 grand kids. All are local. She helps care for the grand children (9, 11, 14)   Past Medical History:  Diagnosis Date  . Allergic rhinitis   . Diverticulosis   . MVP (mitral valve prolapse)   . Osteopenia    Intolerant of Fosamax    Past Surgical History:  Procedure Laterality Date  . TUBAL LIGATION      Current Outpatient Prescriptions  Medication Sig Dispense Refill  . aspirin EC 81 MG tablet Take 81 mg by mouth daily.    Marland Kitchen levETIRAcetam (KEPPRA) 500 MG tablet Take 1 tablet (500 mg total) by mouth 2 (two) times daily. 180 tablet 4  . Multiple Vitamins-Minerals (MULTIVITAMIN PO) Take by mouth daily as needed (for supplement).      No current facility-administered medications for this visit.   She is on the [place] for one possible seizure 2 years ago. She had a syncopal episode, negative w/u. Primary thought it could have been a seizure. Was told she would likely come off of it after 3 years. She thinks it was because she gave blood, she thinks she was too thin.   Family History  Problem Relation Age of Onset  . Stroke Mother   . Diabetes Mother   . Hypertension Mother   . Colon  cancer Father   . Breast cancer Sister     Review of Systems  Constitutional: Negative.   HENT: Negative.   Eyes: Negative.   Respiratory: Negative.   Cardiovascular: Negative.   Gastrointestinal: Negative.   Endocrine: Negative.   Genitourinary: Negative.   Musculoskeletal: Negative.   Skin: Negative.   Allergic/Immunologic: Negative.   Neurological: Negative.   Hematological: Negative.   Psychiatric/Behavioral: Negative.     Exam:   BP 138/70 (BP Location: Right Arm, Patient Position: Sitting, Cuff Size: Normal)   Pulse 76   Resp 16   Ht 5' 4.75" (1.645 m)   Wt 124 lb (56.2 kg)   LMP 02/25/1992   BMI 20.79 kg/m   Weight change: @WEIGHTCHANGE @ Height:   Height: 5' 4.75" (164.5 cm)  Ht Readings from Last 3 Encounters:  11/20/16 5' 4.75" (1.645 m)  11/12/15 5' 4.5" (1.638 m)  09/13/15 5' 5.5" (1.664 m)    General appearance: alert, cooperative and appears stated age Head: Normocephalic, without obvious abnormality, atraumatic Neck: no adenopathy, supple, symmetrical, trachea midline and thyroid normal to inspection and palpation Lungs: clear to auscultation bilaterally Cardiovascular: regular rate and rhythm Breasts: normal appearance, no masses or tenderness Abdomen: soft, non-tender; non distended,  no masses,  no organomegaly Extremities: extremities normal, atraumatic, no cyanosis or edema Skin: Skin color, texture, turgor normal. No rashes or lesions Lymph nodes: Cervical, supraclavicular, and axillary nodes normal. No abnormal inguinal nodes palpated Neurologic: Grossly normal   Pelvic: External genitalia:  no lesions              Urethra:  normal appearing urethra with no masses, tenderness or lesions              Bartholins and Skenes: normal                 Vagina: normal appearing atrophic vagina with normal color and discharge, no lesions              Cervix: no lesions               Bimanual Exam:  Uterus:  normal size, contour, position,  consistency, mobility, non-tender              Adnexa: no mass, fullness, tenderness               Rectovaginal: Confirms               Anus:  normal sphincter tone, no lesions  Chaperone was present for exam.  A:  Well Woman with normal exam  H/O vit d def, on calcium with vit d  P:   No pap needed  Mammogram and colonoscopy UTD  Last year she had an elevated triglycerides, had f/u fasting with her primary and it was normal.   Will check labs today (all she has eaten is a banana and coffee)  Vit D level  Discussed breast self exam  Discussed calcium and vit D intake

## 2016-11-21 LAB — COMPREHENSIVE METABOLIC PANEL
ALBUMIN: 4.2 g/dL (ref 3.5–4.8)
ALT: 13 IU/L (ref 0–32)
AST: 19 IU/L (ref 0–40)
Albumin/Globulin Ratio: 1.2 (ref 1.2–2.2)
Alkaline Phosphatase: 112 IU/L (ref 39–117)
BUN / CREAT RATIO: 13 (ref 12–28)
BUN: 11 mg/dL (ref 8–27)
Bilirubin Total: 0.7 mg/dL (ref 0.0–1.2)
CALCIUM: 9.7 mg/dL (ref 8.7–10.3)
CO2: 26 mmol/L (ref 20–29)
CREATININE: 0.84 mg/dL (ref 0.57–1.00)
Chloride: 99 mmol/L (ref 96–106)
GFR calc Af Amer: 78 mL/min/{1.73_m2} (ref >59)
GFR, EST NON AFRICAN AMERICAN: 68 mL/min/{1.73_m2} (ref >59)
GLOBULIN, TOTAL: 3.4 g/dL (ref 1.5–4.5)
GLUCOSE: 98 mg/dL (ref 65–99)
Potassium: 4.8 mmol/L (ref 3.5–5.2)
SODIUM: 140 mmol/L (ref 134–144)
TOTAL PROTEIN: 7.6 g/dL (ref 6.0–8.5)

## 2016-11-21 LAB — CBC
HEMATOCRIT: 40.7 % (ref 34.0–46.6)
HEMOGLOBIN: 13.3 g/dL (ref 11.1–15.9)
MCH: 30.6 pg (ref 26.6–33.0)
MCHC: 32.7 g/dL (ref 31.5–35.7)
MCV: 94 fL (ref 79–97)
Platelets: 305 10*3/uL (ref 150–379)
RBC: 4.35 x10E6/uL (ref 3.77–5.28)
RDW: 13 % (ref 12.3–15.4)
WBC: 7.1 10*3/uL (ref 3.4–10.8)

## 2016-11-21 LAB — VITAMIN D 25 HYDROXY (VIT D DEFICIENCY, FRACTURES): Vit D, 25-Hydroxy: 48.2 ng/mL (ref 30.0–100.0)

## 2016-11-21 LAB — LIPID PANEL
CHOL/HDL RATIO: 2.4 ratio (ref 0.0–4.4)
Cholesterol, Total: 180 mg/dL (ref 100–199)
HDL: 75 mg/dL (ref >39)
LDL CALC: 77 mg/dL (ref 0–99)
Triglycerides: 138 mg/dL (ref 0–149)
VLDL CHOLESTEROL CAL: 28 mg/dL (ref 5–40)

## 2016-11-24 ENCOUNTER — Telehealth: Payer: Self-pay | Admitting: *Deleted

## 2016-11-24 NOTE — Telephone Encounter (Signed)
-----   Message from Heidi Dom, MD sent at 11/21/2016 12:28 PM EDT ----- Please advise the patient of normal results.

## 2016-11-24 NOTE — Telephone Encounter (Signed)
Left message to call for results (DPR says to leave message at work number- I did not) -eh

## 2016-11-25 NOTE — Telephone Encounter (Signed)
Spoke with patient and gave normal results -eh

## 2016-12-30 ENCOUNTER — Telehealth: Payer: Self-pay | Admitting: Diagnostic Neuroimaging

## 2016-12-30 MED ORDER — LEVETIRACETAM 500 MG PO TABS: 500.0000 mg | ORAL_TABLET | Freq: Two times a day (BID) | ORAL | 4 refills | Status: DC

## 2016-12-30 NOTE — Telephone Encounter (Signed)
Pt calling for refill of levETIRAcetam (KEPPRA) 500 MG tablet, please send to  [place] [place] [phone] (Phone) [phone] (Fax)

## 2016-12-30 NOTE — Telephone Encounter (Signed)
Pt has RV in 05/2017.

## 2016-12-30 NOTE — Addendum Note (Signed)
Addended by: Brandon Melnick on: 12/30/2016 03:55 PM   Modules accepted: Orders

## 2017-06-02 ENCOUNTER — Ambulatory Visit: Payer: BC Managed Care – PPO | Admitting: Diagnostic Neuroimaging

## 2017-06-02 ENCOUNTER — Encounter: Payer: Self-pay | Admitting: Diagnostic Neuroimaging

## 2017-06-02 ENCOUNTER — Telehealth: Payer: Self-pay | Admitting: Diagnostic Neuroimaging

## 2017-06-02 VITALS — BP 124/75 | HR 82 | Ht 64.75 in | Wt 120.6 lb

## 2017-06-02 DIAGNOSIS — R569 Unspecified convulsions: Secondary | ICD-10-CM | POA: Diagnosis not present

## 2017-06-02 MED ORDER — LEVETIRACETAM 500 MG PO TABS: 500.0000 mg | ORAL_TABLET | Freq: Two times a day (BID) | ORAL | 4 refills | Status: DC
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
## 2017-06-02 NOTE — Telephone Encounter (Signed)
Patient states she will call us back to schedule her 1 yr f/u w/ NP per Dr. Leta Baptist.

## 2017-11-09 ENCOUNTER — Other Ambulatory Visit: Payer: Self-pay | Admitting: Obstetrics and Gynecology

## 2017-11-09 DIAGNOSIS — Z1231 Encounter for screening mammogram for malignant neoplasm of breast: Secondary | ICD-10-CM

## 2017-11-11 ENCOUNTER — Ambulatory Visit
Admission: RE | Admit: 2017-11-11 | Discharge: 2017-11-11 | Disposition: A | Discharge status: Home / Self Care | Payer: BC Managed Care – PPO | Source: Ambulatory Visit | Attending: Obstetrics and Gynecology | Admitting: Obstetrics and Gynecology

## 2017-11-11 ENCOUNTER — Ambulatory Visit: Payer: BC Managed Care – PPO

## 2017-11-11 DIAGNOSIS — Z1231 Encounter for screening mammogram for malignant neoplasm of breast: Secondary | ICD-10-CM
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
## 2017-11-25 ENCOUNTER — Ambulatory Visit: Payer: BC Managed Care – PPO | Admitting: Obstetrics and Gynecology

## 2017-11-26 ENCOUNTER — Ambulatory Visit: Payer: BC Managed Care – PPO | Admitting: Obstetrics and Gynecology

## 2017-11-26 ENCOUNTER — Other Ambulatory Visit: Payer: Self-pay

## 2017-11-26 ENCOUNTER — Encounter: Payer: Self-pay | Admitting: Obstetrics and Gynecology

## 2017-11-26 VITALS — BP 126/80 | HR 84 | Ht 65.5 in | Wt 120.2 lb

## 2017-11-26 DIAGNOSIS — M8580 Other specified disorders of bone density and structure, unspecified site: Secondary | ICD-10-CM | POA: Diagnosis not present

## 2017-11-26 DIAGNOSIS — Z0000 Encounter for general adult medical examination without abnormal findings: Secondary | ICD-10-CM | POA: Diagnosis not present

## 2017-11-26 DIAGNOSIS — Z01419 Encounter for gynecological examination (general) (routine) without abnormal findings: Secondary | ICD-10-CM | POA: Diagnosis not present

## 2017-11-26 DIAGNOSIS — E559 Vitamin D deficiency, unspecified: Secondary | ICD-10-CM | POA: Diagnosis not present

## 2017-11-26 NOTE — Patient Instructions (Signed)
EXERCISE AND DIET:  We recommended that you start or continue a regular exercise program for good health. Regular exercise means any activity that makes your heart beat faster and makes you sweat.  We recommend exercising at least 30 minutes per day at least 3 days a week, preferably 4 or 5.  We also recommend a diet low in fat and sugar.  Inactivity, poor dietary choices and obesity can cause diabetes, heart attack, stroke, and kidney damage, among others.    ALCOHOL AND SMOKING:  Women should limit their alcohol intake to no more than 7 drinks/beers/glasses of wine (combined, not each!) per week. Moderation of alcohol intake to this level decreases your risk of breast cancer and liver damage. And of course, no recreational drugs are part of a healthy lifestyle.  And absolutely no smoking or even second hand smoke. Most people know smoking can cause heart and lung diseases, but did you know it also contributes to weakening of your bones? Aging of your skin?  Yellowing of your teeth and nails?  CALCIUM AND VITAMIN D:  Adequate intake of calcium and Vitamin D are recommended.  The recommendations for exact amounts of these supplements seem to change often, but generally speaking 600 mg of calcium (either carbonate or citrate) and 800 units of Vitamin D per day seems prudent. Certain women may benefit from higher intake of Vitamin D.  If you are among these women, your doctor will have told you during your visit.    PAP SMEARS:  Pap smears, to check for cervical cancer or precancers,  have traditionally been done yearly, although recent scientific advances have shown that most women can have pap smears less often.  However, every woman still should have a physical exam from her gynecologist every year. It will include a breast check, inspection of the vulva and vagina to check for abnormal growths or skin changes, a visual exam of the cervix, and then an exam to evaluate the size and shape of the uterus and  ovaries.  And after 77 years of age, a rectal exam is indicated to check for rectal cancers. We will also provide age appropriate advice regarding health maintenance, like when you should have certain vaccines, screening for sexually transmitted diseases, bone density testing, colonoscopy, mammograms, etc.   MAMMOGRAMS:  All women over 40 years old should have a yearly mammogram. Many facilities now offer a "3D" mammogram, which may cost around $50 extra out of pocket. If possible,  we recommend you accept the option to have the 3D mammogram performed.  It both reduces the number of women who will be called back for extra views which then turn out to be normal, and it is better than the routine mammogram at detecting truly abnormal areas.    COLONOSCOPY:  Colonoscopy to screen for colon cancer is recommended for all women at age 50.  We know, you hate the idea of the prep.  We agree, BUT, having colon cancer and not knowing it is worse!!  Colon cancer so often starts as a polyp that can be seen and removed at colonscopy, which can quite literally save your life!  And if your first colonoscopy is normal and you have no family history of colon cancer, most women don't have to have it again for 10 years.  Once every ten years, you can do something that may end up saving your life, right?  We will be happy to help you get it scheduled when you are ready.    Be sure to check your insurance coverage so you understand how much it will cost.  It may be covered as a preventative service at no cost, but you should check your particular policy.      Breast Self-Awareness Breast self-awareness means being familiar with how your breasts look and feel. It involves checking your breasts regularly and reporting any changes to your health care provider. Practicing breast self-awareness is important. A change in your breasts can be a sign of a serious medical problem. Being familiar with how your breasts look and feel allows  you to find any problems early, when treatment is more likely to be successful. All women should practice breast self-awareness, including women who have had breast implants. How to do a breast self-exam One way to learn what is normal for your breasts and whether your breasts are changing is to do a breast self-exam. To do a breast self-exam: Look for Changes  1. Remove all the clothing above your waist. 2. Stand in front of a mirror in a room with good lighting. 3. Put your hands on your hips. 4. Push your hands firmly downward. 5. Compare your breasts in the mirror. Look for differences between them (asymmetry), such as: ? Differences in shape. ? Differences in size. ? Puckers, dips, and bumps in one breast and not the other. 6. Look at each breast for changes in your skin, such as: ? Redness. ? Scaly areas. 7. Look for changes in your nipples, such as: ? Discharge. ? Bleeding. ? Dimpling. ? Redness. ? A change in position. Feel for Changes  Carefully feel your breasts for lumps and changes. It is best to do this while lying on your back on the floor and again while sitting or standing in the shower or tub with soapy water on your skin. Feel each breast in the following way:  Place the arm on the side of the breast you are examining above your head.  Feel your breast with the other hand.  Start in the nipple area and make  inch (2 cm) overlapping circles to feel your breast. Use the pads of your three middle fingers to do this. Apply light pressure, then medium pressure, then firm pressure. The light pressure will allow you to feel the tissue closest to the skin. The medium pressure will allow you to feel the tissue that is a little deeper. The firm pressure will allow you to feel the tissue close to the ribs.  Continue the overlapping circles, moving downward over the breast until you feel your ribs below your breast.  Move one finger-width toward the center of the body.  Continue to use the  inch (2 cm) overlapping circles to feel your breast as you move slowly up toward your collarbone.  Continue the up and down exam using all three pressures until you reach your armpit.  Write Down What You Find  Write down what is normal for each breast and any changes that you find. Keep a written record with breast changes or normal findings for each breast. By writing this information down, you do not need to depend only on memory for size, tenderness, or location. Write down where you are in your menstrual cycle, if you are still menstruating. If you are having trouble noticing differences in your breasts, do not get discouraged. With time you will become more familiar with the variations in your breasts and more comfortable with the exam. How often should I examine my breasts? Examine   your breasts every month. If you are breastfeeding, the best time to examine your breasts is after a feeding or after using a breast pump. If you menstruate, the best time to examine your breasts is 5-7 days after your period is over. During your period, your breasts are lumpier, and it may be more difficult to notice changes. When should I see my health care provider? See your health care provider if you notice:  A change in shape or size of your breasts or nipples.  A change in the skin of your breast or nipples, such as a reddened or scaly area.  Unusual discharge from your nipples.  A lump or thick area that was not there before.  Pain in your breasts.  Anything that concerns you.  This information is not intended to replace advice given to you by your health care provider. Make sure you discuss any questions you have with your health care provider. Document Released: 02/10/2005 Document Revised: 07/19/2015 Document Reviewed: 12/31/2014 Elsevier Interactive Patient Education  2018 Elsevier Inc.  

## 2017-11-27 LAB — LIPID PANEL
Chol/HDL Ratio: 2.9 ratio (ref 0.0–4.4)
Cholesterol, Total: 196 mg/dL (ref 100–199)
HDL: 67 mg/dL (ref >39)
LDL Calculated: 102 mg/dL — ABNORMAL HIGH (ref 0–99)
Triglycerides: 136 mg/dL (ref 0–149)
VLDL Cholesterol Cal: 27 mg/dL (ref 5–40)

## 2017-11-27 LAB — COMPREHENSIVE METABOLIC PANEL
A/G RATIO: 1.7 (ref 1.2–2.2)
ALBUMIN: 4.5 g/dL (ref 3.5–4.8)
ALT: 14 IU/L (ref 0–32)
AST: 18 IU/L (ref 0–40)
Alkaline Phosphatase: 104 IU/L (ref 39–117)
BUN / CREAT RATIO: 13 (ref 12–28)
BUN: 12 mg/dL (ref 8–27)
Bilirubin Total: 0.9 mg/dL (ref 0.0–1.2)
CO2: 26 mmol/L (ref 20–29)
Calcium: 9.9 mg/dL (ref 8.7–10.3)
Chloride: 99 mmol/L (ref 96–106)
Creatinine, Ser: 0.96 mg/dL (ref 0.57–1.00)
GFR calc non Af Amer: 57 mL/min/{1.73_m2} — ABNORMAL LOW (ref >59)
GFR, EST AFRICAN AMERICAN: 66 mL/min/{1.73_m2} (ref >59)
GLOBULIN, TOTAL: 2.6 g/dL (ref 1.5–4.5)
Glucose: 85 mg/dL (ref 65–99)
Potassium: 4.2 mmol/L (ref 3.5–5.2)
SODIUM: 142 mmol/L (ref 134–144)
TOTAL PROTEIN: 7.1 g/dL (ref 6.0–8.5)

## 2017-11-27 LAB — CBC
HEMATOCRIT: 39.2 % (ref 34.0–46.6)
HEMOGLOBIN: 13.1 g/dL (ref 11.1–15.9)
MCH: 31.6 pg (ref 26.6–33.0)
MCHC: 33.4 g/dL (ref 31.5–35.7)
MCV: 95 fL (ref 79–97)
Platelets: 331 10*3/uL (ref 150–450)
RBC: 4.15 x10E6/uL (ref 3.77–5.28)
RDW: 13.9 % (ref 12.3–15.4)
WBC: 7 10*3/uL (ref 3.4–10.8)

## 2017-11-27 LAB — VITAMIN D 25 HYDROXY (VIT D DEFICIENCY, FRACTURES): VIT D 25 HYDROXY: 42.9 ng/mL (ref 30.0–100.0)

## 2017-11-30 ENCOUNTER — Telehealth: Payer: Self-pay

## 2017-11-30 NOTE — Telephone Encounter (Signed)
Left message to call Kaitlyn at [phone]. 

## 2017-11-30 NOTE — Telephone Encounter (Signed)
-----   Message from Heidi Dom, MD sent at 11/27/2017  1:59 PM EDT ----- Please let the patient know that her renal function was mildly low. It's probably time for her to establish care with a primary MD The rest of her lab work is fabulous.

## 2017-12-01 NOTE — Telephone Encounter (Signed)
Patient returning call to [facility]. Patient stated to please call her work number as that is her direct line and can be reached there during work hours.

## 2017-12-02 NOTE — Telephone Encounter (Signed)
Spoke with patient. Results given. Patient verbalizes understanding. States that she has a PCP that she has been seeing and will call to schedule an appointment for further evaluation. Will call if she needs any assistance with scheduling. Encounter closed.

## 2017-12-04 ENCOUNTER — Ambulatory Visit: Payer: BC Managed Care – PPO

## 2018-08-02 ENCOUNTER — Telehealth: Payer: Self-pay | Admitting: Diagnostic Neuroimaging

## 2018-08-02 MED ORDER — LEVETIRACETAM 500 MG PO TABS: 500.0000 mg | ORAL_TABLET | Freq: Two times a day (BID) | ORAL | 0 refills | Status: DC

## 2018-08-02 NOTE — Telephone Encounter (Signed)
Called home #, LVM advising the patient she needs to call and schedule follow up, last seen 05/2017. Advised she may schedule with A Lomax NP or Dr Leta Baptist, whomever has first availability. I advised will refill Keppra x 1 month. Left office number. Keppra refilled x 1 month with note to pharmacy: needs to schedule FU.

## 2018-08-02 NOTE — Telephone Encounter (Signed)
Pt is needing a refill on her levETIRAcetam (KEPPRA) 500 MG tablet sent to [facility]

## 2018-08-25 ENCOUNTER — Other Ambulatory Visit: Payer: Self-pay

## 2018-08-25 ENCOUNTER — Encounter: Payer: Self-pay | Admitting: Family Medicine

## 2018-08-25 ENCOUNTER — Ambulatory Visit: Payer: BC Managed Care – PPO | Admitting: Family Medicine

## 2018-08-25 VITALS — BP 130/77 | HR 87 | Temp 97.3°F | Ht 65.5 in | Wt 122.0 lb

## 2018-08-25 DIAGNOSIS — R569 Unspecified convulsions: Secondary | ICD-10-CM | POA: Diagnosis not present

## 2018-08-25 MED ORDER — LEVETIRACETAM 500 MG PO TABS: 500.0000 mg | ORAL_TABLET | Freq: Two times a day (BID) | ORAL | 3 refills | Status: DC
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
## 2018-08-25 NOTE — Patient Instructions (Signed)
Continue levetiracetam 500mg  twice daily  Follow up in 1 year    Fall Prevention in the Home, Adult Falls can cause injuries. They can happen to people of all ages. There are many things you can do to make your home safe and to help prevent falls. Ask for help when making these changes, if needed. What actions can I take to prevent falls? General Instructions  Use good lighting in all rooms. Replace any light bulbs that burn out.  Turn on the lights when you go into a dark area. Use night-lights.  Keep items that you use often in easy-to-reach places. Lower the shelves around your home if necessary.  Set up your furniture so you have a clear path. Avoid moving your furniture around.  Do not have throw rugs and other things on the floor that can make you trip.  Avoid walking on wet floors.  If any of your floors are uneven, fix them.  Add color or contrast paint or tape to clearly mark and help you see: ? Any grab bars or handrails. ? First and last steps of stairways. ? Where the edge of each step is.  If you use a stepladder: ? Make sure that it is fully opened. Do not climb a closed stepladder. ? Make sure that both sides of the stepladder are locked into place. ? Ask someone to hold the stepladder for you while you use it.  If there are any pets around you, be aware of where they are. What can I do in the bathroom?      Keep the floor dry. Clean up any water that spills onto the floor as soon as it happens.  Remove soap buildup in the tub or shower regularly.  Use non-skid mats or decals on the floor of the tub or shower.  Attach bath mats securely with double-sided, non-slip rug tape.  If you need to sit down in the shower, use a plastic, non-slip stool.  Install grab bars by the toilet and in the tub and shower. Do not use towel bars as grab bars. What can I do in the bedroom?  Make sure that you have a light by your bed that is easy to reach.  Do not use  any sheets or blankets that are too big for your bed. They should not hang down onto the floor.  Have a firm chair that has side arms. You can use this for support while you get dressed. What can I do in the kitchen?  Clean up any spills right away.  If you need to reach something above you, use a strong step stool that has a grab bar.  Keep electrical cords out of the way.  Do not use floor polish or wax that makes floors slippery. If you must use wax, use non-skid floor wax. What can I do with my stairs?  Do not leave any items on the stairs.  Make sure that you have a light switch at the top of the stairs and the bottom of the stairs. If you do not have them, ask someone to add them for you.  Make sure that there are handrails on both sides of the stairs, and use them. Fix handrails that are broken or loose. Make sure that handrails are as long as the stairways.  Install non-slip stair treads on all stairs in your home.  Avoid having throw rugs at the top or bottom of the stairs. If you do have throw rugs,  attach them to the floor with carpet tape.  Choose a carpet that does not hide the edge of the steps on the stairway.  Check any carpeting to make sure that it is firmly attached to the stairs. Fix any carpet that is loose or worn. What can I do on the outside of my home?  Use bright outdoor lighting.  Regularly fix the edges of walkways and driveways and fix any cracks.  Remove anything that might make you trip as you walk through a door, such as a raised step or threshold.  Trim any bushes or trees on the path to your home.  Regularly check to see if handrails are loose or broken. Make sure that both sides of any steps have handrails.  Install guardrails along the edges of any raised decks and porches.  Clear walking paths of anything that might make someone trip, such as tools or rocks.  Have any leaves, snow, or ice cleared regularly.  Use sand or salt on walking  paths during winter.  Clean up any spills in your garage right away. This includes grease or oil spills. What other actions can I take?  Wear shoes that: ? Have a low heel. Do not wear high heels. ? Have rubber bottoms. ? Are comfortable and fit you well. ? Are closed at the toe. Do not wear open-toe sandals.  Use tools that help you move around (mobility aids) if they are needed. These include: ? Canes. ? Walkers. ? Scooters. ? Crutches.  Review your medicines with your doctor. Some medicines can make you feel dizzy. This can increase your chance of falling. Ask your doctor what other things you can do to help prevent falls. Where to find more information  Centers for Disease Control and Prevention, STEADI: https://garcia.biz/  [employer] on Aging: BrainJudge.co.uk Contact a doctor if:  You are afraid of falling at home.  You feel weak, drowsy, or dizzy at home.  You fall at home. Summary  There are many simple things that you can do to make your home safe and to help prevent falls.  Ways to make your home safe include removing tripping hazards and installing grab bars in the bathroom.  Ask for help when making these changes in your home. This information is not intended to replace advice given to you by your health care provider. Make sure you discuss any questions you have with your health care provider. Document Released: 12/07/2008 Document Revised: 06/03/2018 Document Reviewed: 09/25/2016 Elsevier Patient Education  2020 [employer].

## 2018-08-26 ENCOUNTER — Encounter: Payer: Self-pay | Admitting: Family Medicine

## 2018-09-02 NOTE — Progress Notes (Signed)
I reviewed note and agree with plan.   Penni Bombard, MD 05/26/1462, 3:14 PM Certified in Neurology, Neurophysiology and Neuroimaging  [facility] [address], [place], [place] [address] [phone]

## 2018-09-28 ENCOUNTER — Other Ambulatory Visit: Payer: Self-pay | Admitting: Obstetrics and Gynecology

## 2018-09-28 DIAGNOSIS — Z1231 Encounter for screening mammogram for malignant neoplasm of breast: Secondary | ICD-10-CM

## 2018-11-17 ENCOUNTER — Other Ambulatory Visit: Payer: Self-pay

## 2018-11-17 ENCOUNTER — Ambulatory Visit
Admission: RE | Admit: 2018-11-17 | Discharge: 2018-11-17 | Disposition: A | Discharge status: Home / Self Care | Payer: BC Managed Care – PPO | Source: Ambulatory Visit | Attending: Obstetrics and Gynecology | Admitting: Obstetrics and Gynecology

## 2018-11-17 DIAGNOSIS — Z1231 Encounter for screening mammogram for malignant neoplasm of breast: Secondary | ICD-10-CM

## 2018-12-01 ENCOUNTER — Ambulatory Visit (INDEPENDENT_AMBULATORY_CARE_PROVIDER_SITE_OTHER): Payer: BC Managed Care – PPO | Admitting: Obstetrics and Gynecology

## 2018-12-01 ENCOUNTER — Other Ambulatory Visit: Payer: Self-pay

## 2018-12-01 ENCOUNTER — Encounter: Payer: Self-pay | Admitting: Obstetrics and Gynecology

## 2018-12-01 VITALS — BP 118/72 | HR 84 | Temp 97.1°F | Ht 65.5 in | Wt 121.0 lb

## 2018-12-01 DIAGNOSIS — E559 Vitamin D deficiency, unspecified: Secondary | ICD-10-CM

## 2018-12-01 DIAGNOSIS — Z01419 Encounter for gynecological examination (general) (routine) without abnormal findings: Secondary | ICD-10-CM

## 2018-12-01 DIAGNOSIS — M8580 Other specified disorders of bone density and structure, unspecified site: Secondary | ICD-10-CM

## 2018-12-01 DIAGNOSIS — Z1211 Encounter for screening for malignant neoplasm of colon: Secondary | ICD-10-CM

## 2018-12-01 DIAGNOSIS — Z0000 Encounter for general adult medical examination without abnormal findings: Secondary | ICD-10-CM | POA: Diagnosis not present

## 2018-12-01 DIAGNOSIS — Z9189 Other specified personal risk factors, not elsewhere classified: Secondary | ICD-10-CM

## 2018-12-01 NOTE — Patient Instructions (Signed)
EXERCISE AND DIET:  We recommended that you start or continue a regular exercise program for good health. Regular exercise means any activity that makes your heart beat faster and makes you sweat.  We recommend exercising at least 30 minutes per day at least 3 days a week, preferably 4 or 5.  We also recommend a diet low in fat and sugar.  Inactivity, poor dietary choices and obesity can cause diabetes, heart attack, stroke, and kidney damage, among others.   ° °ALCOHOL AND SMOKING:  Women should limit their alcohol intake to no more than 7 drinks/beers/glasses of wine (combined, not each!) per week. Moderation of alcohol intake to this level decreases your risk of breast cancer and liver damage. And of course, no recreational drugs are part of a healthy lifestyle.  And absolutely no smoking or even second hand smoke. Most people know smoking can cause heart and lung diseases, but did you know it also contributes to weakening of your bones? Aging of your skin?  Yellowing of your teeth and nails? ° °CALCIUM AND VITAMIN D:  Adequate intake of calcium and Vitamin D are recommended.  The recommendations for exact amounts of these supplements seem to change often, but generally speaking 1,200 mg of calcium (between diet and supplement) and 800 units of Vitamin D per day seems prudent. Certain women may benefit from higher intake of Vitamin D.  If you are among these women, your doctor will have told you during your visit.   ° °PAP SMEARS:  Pap smears, to check for cervical cancer or precancers,  have traditionally been done yearly, although recent scientific advances have shown that most women can have pap smears less often.  However, every woman still should have a physical exam from her gynecologist every year. It will include a breast check, inspection of the vulva and vagina to check for abnormal growths or skin changes, a visual exam of the cervix, and then an exam to evaluate the size and shape of the uterus and  ovaries.  And after 78 years of age, a rectal exam is indicated to check for rectal cancers. We will also provide age appropriate advice regarding health maintenance, like when you should have certain vaccines, screening for sexually transmitted diseases, bone density testing, colonoscopy, mammograms, etc.  ° °MAMMOGRAMS:  All women over 40 years old should have a yearly mammogram. Many facilities now offer a "3D" mammogram, which may cost around $50 extra out of pocket. If possible,  we recommend you accept the option to have the 3D mammogram performed.  It both reduces the number of women who will be called back for extra views which then turn out to be normal, and it is better than the routine mammogram at detecting truly abnormal areas.   ° °COLON CANCER SCREENING: Now recommend starting at age 45. At this time colonoscopy is not covered for routine screening until 50. There are take home tests that can be done between 45-49.  ° °COLONOSCOPY:  Colonoscopy to screen for colon cancer is recommended for all women at age 50.  We know, you hate the idea of the prep.  We agree, BUT, having colon cancer and not knowing it is worse!!  Colon cancer so often starts as a polyp that can be seen and removed at colonscopy, which can quite literally save your life!  And if your first colonoscopy is normal and you have no family history of colon cancer, most women don't have to have it again for   10 years.  Once every ten years, you can do something that may end up saving your life, right?  We will be happy to help you get it scheduled when you are ready.  Be sure to check your insurance coverage so you understand how much it will cost.  It may be covered as a preventative service at no cost, but you should check your particular policy.   ° ° ° °Breast Self-Awareness °Breast self-awareness means being familiar with how your breasts look and feel. It involves checking your breasts regularly and reporting any changes to your  health care provider. °Practicing breast self-awareness is important. A change in your breasts can be a sign of a serious medical problem. Being familiar with how your breasts look and feel allows you to find any problems early, when treatment is more likely to be successful. All women should practice breast self-awareness, including women who have had breast implants. °How to do a breast self-exam °One way to learn what is normal for your breasts and whether your breasts are changing is to do a breast self-exam. To do a breast self-exam: °Look for Changes ° °1. Remove all the clothing above your waist. °2. Stand in front of a mirror in a room with good lighting. °3. Put your hands on your hips. °4. Push your hands firmly downward. °5. Compare your breasts in the mirror. Look for differences between them (asymmetry), such as: °? Differences in shape. °? Differences in size. °? Puckers, dips, and bumps in one breast and not the other. °6. Look at each breast for changes in your skin, such as: °? Redness. °? Scaly areas. °7. Look for changes in your nipples, such as: °? Discharge. °? Bleeding. °? Dimpling. °? Redness. °? A change in position. °Feel for Changes °Carefully feel your breasts for lumps and changes. It is best to do this while lying on your back on the floor and again while sitting or standing in the shower or tub with soapy water on your skin. Feel each breast in the following way: °· Place the arm on the side of the breast you are examining above your head. °· Feel your breast with the other hand. °· Start in the nipple area and make ¾ inch (2 cm) overlapping circles to feel your breast. Use the pads of your three middle fingers to do this. Apply light pressure, then medium pressure, then firm pressure. The light pressure will allow you to feel the tissue closest to the skin. The medium pressure will allow you to feel the tissue that is a little deeper. The firm pressure will allow you to feel the tissue  close to the ribs. °· Continue the overlapping circles, moving downward over the breast until you feel your ribs below your breast. °· Move one finger-width toward the center of the body. Continue to use the ¾ inch (2 cm) overlapping circles to feel your breast as you move slowly up toward your collarbone. °· Continue the up and down exam using all three pressures until you reach your armpit. ° °Write Down What You Find ° °Write down what is normal for each breast and any changes that you find. Keep a written record with breast changes or normal findings for each breast. By writing this information down, you do not need to depend only on memory for size, tenderness, or location. Write down where you are in your menstrual cycle, if you are still menstruating. °If you are having trouble noticing differences   in your breasts, do not get discouraged. With time you will become more familiar with the variations in your breasts and more comfortable with the exam. How often should I examine my breasts? Examine your breasts every month. If you are breastfeeding, the best time to examine your breasts is after a feeding or after using a breast pump. If you menstruate, the best time to examine your breasts is 5-7 days after your period is over. During your period, your breasts are lumpier, and it may be more difficult to notice changes. When should I see my health care provider? See your health care provider if you notice:  A change in shape or size of your breasts or nipples.  A change in the skin of your breast or nipples, such as a reddened or scaly area.  Unusual discharge from your nipples.  A lump or thick area that was not there before.  Pain in your breasts.  Anything that concerns you.  Osteopenia  Osteopenia is a loss of thickness (density) inside of the bones. Another name for osteopenia is low bone mass. Mild osteopenia is a normal part of aging. It is not a disease, and it does not cause  symptoms. However, if you have osteopenia and continue to lose bone mass, you could develop a condition that causes the bones to become thin and break more easily (osteoporosis). You may also lose some height, have back pain, and have a stooped posture. Although osteopenia is not a disease, making changes to your lifestyle and diet can help to prevent osteopenia from developing into osteoporosis. What are the causes? Osteopenia is caused by loss of calcium in the bones.  Bones are constantly changing. Old bone cells are continually being replaced with new bone cells. This process builds new bone. The mineral calcium is needed to build new bone and maintain bone density. Bone density is usually highest around age 9. After that, most people's bodies cannot replace all the bone they have lost with new bone. What increases the risk? You are more likely to develop this condition if:  You are older than age 70.  You are a woman who went through menopause early.  You have a long illness that keeps you in bed.  You do not get enough exercise.  You lack certain nutrients (malnutrition).  You have an overactive thyroid gland (hyperthyroidism).  You smoke.  You drink a lot of alcohol.  You are taking medicines that weaken the bones, such as steroids. What are the signs or symptoms? This condition does not cause any symptoms. You may have a slightly higher risk for bone breaks (fractures), so getting fractures more easily than normal may be an indication of osteopenia. How is this diagnosed? Your health care provider can diagnose this condition with a special type of X-ray exam that measures bone density (dual-energy X-ray absorptiometry, DEXA). This test can measure bone density in your hips, spine, and wrists. Osteopenia has no symptoms, so this condition is usually diagnosed after a routine bone density screening test is done for osteoporosis. This routine screening is usually done for:  Women  who are age 33 or older.  Men who are age 22 or older. If you have risk factors for osteopenia, you may have the screening test at an earlier age. How is this treated? Making dietary and lifestyle changes can lower your risk for osteoporosis. If you have severe osteopenia that is close to becoming osteoporosis, your health care provider may prescribe medicines and  dietary supplements such as calcium and vitamin D. These supplements help to rebuild bone density. Follow these instructions at home:   Take over-the-counter and prescription medicines only as told by your health care provider. These include vitamins and supplements.  Eat a diet that is high in calcium and vitamin D. ? Calcium is found in dairy products, beans, salmon, and leafy green vegetables like spinach and broccoli. ? Look for foods that have vitamin D and calcium added to them (fortified foods), such as orange juice, cereal, and bread.  Do 30 or more minutes of a weight-bearing exercise every day, such as walking, jogging, or playing a sport. These types of exercises strengthen the bones.  Take precautions at home to lower your risk of falling, such as: ? Keeping rooms well-lit and free of clutter, such as cords. ? Installing safety rails on stairs. ? Using rubber mats in the bathroom or other areas that are often wet or slippery.  Do not use any products that contain nicotine or tobacco, such as cigarettes and e-cigarettes. If you need help quitting, ask your health care provider.  Avoid alcohol or limit alcohol intake to no more than 1 drink a day for nonpregnant women and 2 drinks a day for men. One drink equals 12 oz of beer, 5 oz of wine, or 1 oz of hard liquor.  Keep all follow-up visits as told by your health care provider. This is important. Contact a health care provider if:  You have not had a bone density screening for osteoporosis and you are: ? A woman, age 77 or older. ? A man, age 29 or older.  You  are a postmenopausal woman who has not had a bone density screening for osteoporosis.  You are older than age 34 and you want to know if you should have bone density screening for osteoporosis. Summary  Osteopenia is a loss of thickness (density) inside of the bones. Another name for osteopenia is low bone mass.  Osteopenia is not a disease, but it may increase your risk for a condition that causes the bones to become thin and break more easily (osteoporosis).  You may be at risk for osteopenia if you are older than age 54 or if you are a woman who went through early menopause.  Osteopenia does not cause any symptoms, but it can be diagnosed with a bone density screening test.  Dietary and lifestyle changes are the first treatment for osteopenia. These may lower your risk for osteoporosis. This information is not intended to replace advice given to you by your health care provider. Make sure you discuss any questions you have with your health care provider. Document Released: 11/19/2016 Document Revised: 01/23/2017 Document Reviewed: 11/19/2016 Elsevier Patient Education  2020 [employer].

## 2018-12-01 NOTE — Progress Notes (Signed)
78 y.o. G81P2002 Divorced White or Caucasian Declined female here for annual exam.  No vaginal bleeding.     Patient's last menstrual period was 02/25/1992.          Sexually active: No.  The current method of family planning is post menopausal status.    Exercising: Yes.    walking, weight bearing exercises Smoker:  no  Health Maintenance: Pap:  11/12/2015 normal with negative HPV, 07/27/2009 normal History of abnormal Pap:  no MMG:  11/17/2018 Birads 1 negative BMD:   10/19/2015 Osteopenia, Frax 13,9% of any fracture, 4.5% of a hip fracture. In the past she was on Fosamax x 6 months. Coughed up blood x1 at 6 months and stopped it. Colonoscopy: 2015, she wants a cologuard  TDaP:  10/21/2013 Gardasil: N/A   reports that she has never smoked. She has never used smokeless tobacco. She reports that she does not drink alcohol or use drugs. She has 2 kids and 3 grand kids. All are local. She helps care for the grand children (11, 13, 16). 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   Past Medical History:  Diagnosis Date  . Allergic rhinitis   . Diverticulosis   . MVP (mitral valve prolapse)   . Osteopenia    Intolerant of Fosamax  She is on the [place] for one possible seizure 2 years ago. She had a syncopal episode, negative w/u. Primary thought it could have been a seizure. Was told she would likely come off of it after 3 years, but she may just stay on it. She thinks it was because she gave blood, she thinks she was too thin.  Past Surgical History:  Procedure Laterality Date  . TUBAL LIGATION      Current Outpatient Medications  Medication Sig Dispense Refill  . levETIRAcetam (KEPPRA) 500 MG tablet Take 1 tablet (500 mg total) by mouth 2 (two) times daily. 180 tablet 3  . Multiple Vitamins-Minerals (MULTIVITAMIN PO) Take by mouth daily as needed (for supplement).      No current facility-administered medications for this visit.     Family History   Problem Relation Age of Onset  . Stroke Mother   . Diabetes Mother   . Hypertension Mother   . Colon cancer Father   . Breast cancer Sister 92  Dad was in his 27's with his colon cancer.   Review of Systems  Constitutional: Negative.   HENT: Negative.   Eyes: Negative.   Respiratory: Negative.   Cardiovascular: Negative.   Gastrointestinal: Negative.   Endocrine: Negative.   Genitourinary: Negative.   Musculoskeletal: Negative.   Skin: Negative.   Allergic/Immunologic: Negative.   Neurological: Negative.   Hematological: Negative.   Psychiatric/Behavioral: Negative.     Exam:   BP 118/72 (BP Location: Right Arm, Patient Position: Sitting, Cuff Size: Normal)   Pulse 84   Temp (!) 97.1 F (36.2 C) (Temporal)   Ht 5' 5.5" (1.664 m)   Wt 121 lb (54.9 kg)   LMP 02/25/1992   BMI 19.83 kg/m   Weight change: @WEIGHTCHANGE @ Height:   Height: 5' 5.5" (166.4 cm)  Ht Readings from Last 3 Encounters:  12/01/18 5' 5.5" (1.664 m)  08/25/18 5' 5.5" (1.664 m)  11/26/17 5' 5.5" (1.664 m)    General appearance: alert, cooperative and appears stated age Head: Normocephalic, without obvious abnormality, atraumatic Neck: no adenopathy, supple, symmetrical, trachea midline and thyroid normal to inspection and palpation Lungs: clear  to auscultation bilaterally Cardiovascular: regular rate and rhythm Breasts: normal appearance, no masses or tenderness Abdomen: soft, non-tender; non distended,  no masses,  no organomegaly Extremities: extremities normal, atraumatic, no cyanosis or edema Skin: Skin color, texture, turgor normal. No rashes or lesions Lymph nodes: Cervical, supraclavicular, and axillary nodes normal. No abnormal inguinal nodes palpated Neurologic: Grossly normal   Pelvic: External genitalia:  no lesions              Urethra:  normal appearing urethra with no masses, tenderness or lesions              Bartholins and Skenes: normal                 Vagina: atrophic  appearing vagina with normal color and discharge, no lesions              Cervix: no lesions               Bimanual Exam:  Uterus:  normal size, contour, position, consistency, mobility, non-tender              Adnexa: no mass, fullness, tenderness               Rectovaginal: Confirms               Anus:  normal sphincter tone, no lesions  Chaperone was present for exam.  A:  Well Woman with normal exam  Osteopenia with elevated fracture risk in 2017, hasn't tolerated Fosamax in the past  Vit d def.  Father with colon cancer  P:   Labs today  DEXA ordered  She isn't sure about another colonoscopy, IFOB given, she will call the GI MD and discuss recommendations   Mammogram UTD  DEXA ordered  Discussed breast self exam  Discussed calcium and vit D intake

## 2018-12-02 LAB — COMPREHENSIVE METABOLIC PANEL
ALT: 13 IU/L (ref 0–32)
AST: 21 IU/L (ref 0–40)
Albumin/Globulin Ratio: 1.3 (ref 1.2–2.2)
Albumin: 4.4 g/dL (ref 3.7–4.7)
Alkaline Phosphatase: 120 IU/L — ABNORMAL HIGH (ref 39–117)
BUN/Creatinine Ratio: 12 (ref 12–28)
BUN: 11 mg/dL (ref 8–27)
Bilirubin Total: 0.7 mg/dL (ref 0.0–1.2)
CO2: 28 mmol/L (ref 20–29)
Calcium: 9.5 mg/dL (ref 8.7–10.3)
Chloride: 99 mmol/L (ref 96–106)
Creatinine, Ser: 0.91 mg/dL (ref 0.57–1.00)
GFR calc Af Amer: 70 mL/min/{1.73_m2} (ref >59)
GFR calc non Af Amer: 61 mL/min/{1.73_m2} (ref >59)
Globulin, Total: 3.3 g/dL (ref 1.5–4.5)
Glucose: 92 mg/dL (ref 65–99)
Potassium: 4.1 mmol/L (ref 3.5–5.2)
Sodium: 139 mmol/L (ref 134–144)
Total Protein: 7.7 g/dL (ref 6.0–8.5)

## 2018-12-02 LAB — LIPID PANEL
Chol/HDL Ratio: 2.8 ratio (ref 0.0–4.4)
Cholesterol, Total: 208 mg/dL — ABNORMAL HIGH (ref 100–199)
HDL: 73 mg/dL (ref >39)
LDL Chol Calc (NIH): 107 mg/dL — ABNORMAL HIGH (ref 0–99)
Triglycerides: 164 mg/dL — ABNORMAL HIGH (ref 0–149)
VLDL Cholesterol Cal: 28 mg/dL (ref 5–40)

## 2018-12-02 LAB — CBC
Hematocrit: 40.1 % (ref 34.0–46.6)
Hemoglobin: 13.8 g/dL (ref 11.1–15.9)
MCH: 31.7 pg (ref 26.6–33.0)
MCHC: 34.4 g/dL (ref 31.5–35.7)
MCV: 92 fL (ref 79–97)
Platelets: 339 10*3/uL (ref 150–450)
RBC: 4.35 x10E6/uL (ref 3.77–5.28)
RDW: 12.1 % (ref 11.7–15.4)
WBC: 9.5 10*3/uL (ref 3.4–10.8)

## 2018-12-02 LAB — VITAMIN D 25 HYDROXY (VIT D DEFICIENCY, FRACTURES): Vit D, 25-Hydroxy: 40.7 ng/mL (ref 30.0–100.0)

## 2019-01-11 ENCOUNTER — Other Ambulatory Visit: Payer: Self-pay

## 2019-01-11 ENCOUNTER — Ambulatory Visit
Admission: RE | Admit: 2019-01-11 | Discharge: 2019-01-11 | Disposition: A | Discharge status: Home / Self Care | Payer: BC Managed Care – PPO | Source: Ambulatory Visit | Attending: Obstetrics and Gynecology | Admitting: Obstetrics and Gynecology

## 2019-01-11 DIAGNOSIS — Z9189 Other specified personal risk factors, not elsewhere classified: Secondary | ICD-10-CM

## 2019-01-11 DIAGNOSIS — M8580 Other specified disorders of bone density and structure, unspecified site: Secondary | ICD-10-CM

## 2019-03-10 ENCOUNTER — Telehealth: Payer: Self-pay | Admitting: Obstetrics and Gynecology

## 2019-03-10 NOTE — Telephone Encounter (Signed)
Patient says she received a call from a nurse to return a call. No open telephone encounter.

## 2019-03-10 NOTE — Telephone Encounter (Signed)
Spoke with patient. Advised no open notes. No refill request. Patient is unsure who called. No current concerns. Patient declined recommended repeat labs from 12/01/18, again declines. Advised I will update Dr. Talbert Nan, our office will return call if any additional recommendations.   Next AEX 12/07/19  Routing to provider for final review. Patient is agreeable to disposition. Will close encounter.

## 2019-05-24 DIAGNOSIS — Z800 Family history of malignant neoplasm of digestive organs: Secondary | ICD-10-CM | POA: Insufficient documentation

## 2019-05-24 DIAGNOSIS — Z86010 Personal history of colonic polyps: Secondary | ICD-10-CM | POA: Insufficient documentation

## 2019-05-24 DIAGNOSIS — D126 Benign neoplasm of colon, unspecified: Secondary | ICD-10-CM | POA: Insufficient documentation

## 2019-09-02 ENCOUNTER — Other Ambulatory Visit: Payer: Self-pay | Admitting: Neurology

## 2019-09-05 ENCOUNTER — Telehealth: Payer: Self-pay | Admitting: Family Medicine

## 2019-09-05 MED ORDER — LEVETIRACETAM 500 MG PO TABS: 500.0000 mg | ORAL_TABLET | Freq: Two times a day (BID) | ORAL | 0 refills | Status: DC

## 2019-09-05 NOTE — Telephone Encounter (Signed)
I LMVM mobile for pt that made appt her her 11-22-19 at 0930 with AL/NP.  She is to call back if this is not good for her.  I will give her 3 month of keppra.

## 2019-09-05 NOTE — Telephone Encounter (Signed)
Pt called and left VM on Friday 10:29am stating she is needing a refill on her levETIRAcetam (KEPPRA) 500 MG tablet  Pt states her pharmacy has sent in a request. Please advise.
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
## 2019-11-22 ENCOUNTER — Encounter: Payer: Self-pay | Admitting: Family Medicine

## 2019-11-22 ENCOUNTER — Telehealth (INDEPENDENT_AMBULATORY_CARE_PROVIDER_SITE_OTHER): Payer: BC Managed Care – PPO | Admitting: Family Medicine

## 2019-11-22 DIAGNOSIS — R569 Unspecified convulsions: Secondary | ICD-10-CM

## 2019-11-22 MED ORDER — LEVETIRACETAM 500 MG PO TABS: 500.0000 mg | ORAL_TABLET | Freq: Two times a day (BID) | ORAL | 3 refills | Status: DC

## 2019-11-29 NOTE — Progress Notes (Signed)
I reviewed note and agree with plan.   Penni Bombard, MD 61/07/1222, 0:01 PM Certified in Neurology, Neurophysiology and Neuroimaging  [facility] [address], [employer] [place], [place] [address] [phone]

## 2019-12-05 ENCOUNTER — Other Ambulatory Visit: Payer: Self-pay | Admitting: Obstetrics and Gynecology

## 2019-12-05 DIAGNOSIS — Z1231 Encounter for screening mammogram for malignant neoplasm of breast: Secondary | ICD-10-CM

## 2019-12-07 ENCOUNTER — Ambulatory Visit: Payer: BC Managed Care – PPO | Admitting: Obstetrics and Gynecology

## 2019-12-12 ENCOUNTER — Ambulatory Visit (INDEPENDENT_AMBULATORY_CARE_PROVIDER_SITE_OTHER): Payer: BC Managed Care – PPO | Admitting: Obstetrics and Gynecology

## 2019-12-12 ENCOUNTER — Other Ambulatory Visit: Payer: Self-pay

## 2019-12-12 ENCOUNTER — Encounter: Payer: Self-pay | Admitting: Obstetrics and Gynecology

## 2019-12-12 VITALS — BP 124/66 | HR 94 | Resp 14 | Ht 65.0 in | Wt 116.0 lb

## 2019-12-12 DIAGNOSIS — Z8739 Personal history of other diseases of the musculoskeletal system and connective tissue: Secondary | ICD-10-CM | POA: Diagnosis not present

## 2019-12-12 DIAGNOSIS — Z01419 Encounter for gynecological examination (general) (routine) without abnormal findings: Secondary | ICD-10-CM | POA: Diagnosis not present

## 2019-12-12 NOTE — Patient Instructions (Addendum)
EXERCISE AND DIET:  We recommended that you start or continue a regular exercise program for good health. Regular exercise means any activity that makes your heart beat faster and makes you sweat.  We recommend exercising at least 30 minutes per day at least 3 days a week, preferably 4 or 5.  We also recommend a diet low in fat and sugar.  Inactivity, poor dietary choices and obesity can cause diabetes, heart attack, stroke, and kidney damage, among others.   ° °ALCOHOL AND SMOKING:  Women should limit their alcohol intake to no more than 7 drinks/beers/glasses of wine (combined, not each!) per week. Moderation of alcohol intake to this level decreases your risk of breast cancer and liver damage. And of course, no recreational drugs are part of a healthy lifestyle.  And absolutely no smoking or even second hand smoke. Most people know smoking can cause heart and lung diseases, but did you know it also contributes to weakening of your bones? Aging of your skin?  Yellowing of your teeth and nails? ° °CALCIUM AND VITAMIN D:  Adequate intake of calcium and Vitamin D are recommended.  The recommendations for exact amounts of these supplements seem to change often, but generally speaking 1,200 mg of calcium (between diet and supplement) and 800 units of Vitamin D per day seems prudent. Certain women may benefit from higher intake of Vitamin D.  If you are among these women, your doctor will have told you during your visit.   ° °PAP SMEARS:  Pap smears, to check for cervical cancer or precancers,  have traditionally been done yearly, although recent scientific advances have shown that most women can have pap smears less often.  However, every woman still should have a physical exam from her gynecologist every year. It will include a breast check, inspection of the vulva and vagina to check for abnormal growths or skin changes, a visual exam of the cervix, and then an exam to evaluate the size and shape of the uterus and  ovaries.  And after 79 years of age, a rectal exam is indicated to check for rectal cancers. We will also provide age appropriate advice regarding health maintenance, like when you should have certain vaccines, screening for sexually transmitted diseases, bone density testing, colonoscopy, mammograms, etc.  ° °MAMMOGRAMS:  All women over 40 years old should have a yearly mammogram. Many facilities now offer a "3D" mammogram, which may cost around $50 extra out of pocket. If possible,  we recommend you accept the option to have the 3D mammogram performed.  It both reduces the number of women who will be called back for extra views which then turn out to be normal, and it is better than the routine mammogram at detecting truly abnormal areas.   ° °COLON CANCER SCREENING: Now recommend starting at age 45. At this time colonoscopy is not covered for routine screening until 50. There are take home tests that can be done between 45-49.  ° °COLONOSCOPY:  Colonoscopy to screen for colon cancer is recommended for all women at age 50.  We know, you hate the idea of the prep.  We agree, BUT, having colon cancer and not knowing it is worse!!  Colon cancer so often starts as a polyp that can be seen and removed at colonscopy, which can quite literally save your life!  And if your first colonoscopy is normal and you have no family history of colon cancer, most women don't have to have it again for   10 years.  Once every ten years, you can do something that may end up saving your life, right?  We will be happy to help you get it scheduled when you are ready.  Be sure to check your insurance coverage so you understand how much it will cost.  It may be covered as a preventative service at no cost, but you should check your particular policy.   ° ° ° °Breast Self-Awareness °Breast self-awareness means being familiar with how your breasts look and feel. It involves checking your breasts regularly and reporting any changes to your  health care provider. °Practicing breast self-awareness is important. A change in your breasts can be a sign of a serious medical problem. Being familiar with how your breasts look and feel allows you to find any problems early, when treatment is more likely to be successful. All women should practice breast self-awareness, including women who have had breast implants. °How to do a breast self-exam °One way to learn what is normal for your breasts and whether your breasts are changing is to do a breast self-exam. To do a breast self-exam: °Look for Changes ° °1. Remove all the clothing above your waist. °2. Stand in front of a mirror in a room with good lighting. °3. Put your hands on your hips. °4. Push your hands firmly downward. °5. Compare your breasts in the mirror. Look for differences between them (asymmetry), such as: °? Differences in shape. °? Differences in size. °? Puckers, dips, and bumps in one breast and not the other. °6. Look at each breast for changes in your skin, such as: °? Redness. °? Scaly areas. °7. Look for changes in your nipples, such as: °? Discharge. °? Bleeding. °? Dimpling. °? Redness. °? A change in position. °Feel for Changes °Carefully feel your breasts for lumps and changes. It is best to do this while lying on your back on the floor and again while sitting or standing in the shower or tub with soapy water on your skin. Feel each breast in the following way: °· Place the arm on the side of the breast you are examining above your head. °· Feel your breast with the other hand. °· Start in the nipple area and make ¾ inch (2 cm) overlapping circles to feel your breast. Use the pads of your three middle fingers to do this. Apply light pressure, then medium pressure, then firm pressure. The light pressure will allow you to feel the tissue closest to the skin. The medium pressure will allow you to feel the tissue that is a little deeper. The firm pressure will allow you to feel the tissue  close to the ribs. °· Continue the overlapping circles, moving downward over the breast until you feel your ribs below your breast. °· Move one finger-width toward the center of the body. Continue to use the ¾ inch (2 cm) overlapping circles to feel your breast as you move slowly up toward your collarbone. °· Continue the up and down exam using all three pressures until you reach your armpit. ° °Write Down What You Find ° °Write down what is normal for each breast and any changes that you find. Keep a written record with breast changes or normal findings for each breast. By writing this information down, you do not need to depend only on memory for size, tenderness, or location. Write down where you are in your menstrual cycle, if you are still menstruating. °If you are having trouble noticing differences   in your breasts, do not get discouraged. With time you will become more familiar with the variations in your breasts and more comfortable with the exam. How often should I examine my breasts? Examine your breasts every month. If you are breastfeeding, the best time to examine your breasts is after a feeding or after using a breast pump. If you menstruate, the best time to examine your breasts is 5-7 days after your period is over. During your period, your breasts are lumpier, and it may be more difficult to notice changes. When should I see my health care provider? See your health care provider if you notice:  A change in shape or size of your breasts or nipples.  A change in the skin of your breast or nipples, such as a reddened or scaly area.  Unusual discharge from your nipples.  A lump or thick area that was not there before.  Pain in your breasts.  Anything that concerns you.   Osteoporosis  Osteoporosis is thinning and loss of density in your bones. Osteoporosis makes bones more brittle and fragile and more likely to break (fracture). Over time, osteoporosis can cause your bones to become  so weak that they fracture after a minor fall. Bones in the hip, wrist, and spine are most likely to fracture due to osteoporosis. What are the causes? The exact cause of this condition is not known. What increases the risk? You may be at greater risk for osteoporosis if you:  Have a family history of the condition.  Have poor nutrition.  Use steroid medicines, such as prednisone.  Are female.  Are age 85 or older.  Smoke or have a history of smoking.  Are not physically active (are sedentary).  Are white (Caucasian) or of Asian descent.  Have a small body frame.  Take certain medicines, such as antiseizure medicines. What are the signs or symptoms? A fracture might be the first sign of osteoporosis, especially if the fracture results from a fall or injury that usually would not cause a bone to break. Other signs and symptoms include:  Pain in the neck or low back.  Stooped posture.  Loss of height. How is this diagnosed? This condition may be diagnosed based on:  Your medical history.  A physical exam.  A bone mineral density test, also called a DXA or DEXA test (dual-energy X-ray absorptiometry test). This test uses X-rays to measure the amount of minerals in your bones. How is this treated? The goal of treatment is to strengthen your bones and lower your risk for a fracture. Treatment may involve:  Making lifestyle changes, such as: ? Including foods with more calcium and vitamin D in your diet. ? Doing weight-bearing and muscle-strengthening exercises. ? Stopping tobacco use. ? Limiting alcohol intake.  Taking medicine to slow the process of bone loss or to increase bone density.  Taking daily supplements of calcium and vitamin D.  Taking hormone replacement medicines, such as estrogen for women and testosterone for men.  Monitoring your levels of calcium and vitamin D. Follow these instructions at home:  Activity  Exercise as told by your health care  provider. Ask your health care provider what exercises and activities are safe for you. You should do: ? Exercises that make you work against gravity (weight-bearing exercises), such as tai chi, yoga, or walking. ? Exercises to strengthen muscles, such as lifting weights. Lifestyle  Limit alcohol intake to no more than 1 drink a day for nonpregnant women and  2 drinks a day for men. One drink equals 12 oz of beer, 5 oz of wine, or 1½ oz of hard liquor. °· Do not use any products that contain nicotine or tobacco, such as cigarettes and e-cigarettes. If you need help quitting, ask your health care provider. °Preventing falls °· Use devices to help you move around (mobility aids) as needed, such as canes, walkers, scooters, or crutches. °· Keep rooms well-lit and clutter-free. °· Remove tripping hazards from walkways, including cords and throw rugs. °· Install grab bars in bathrooms and safety rails on stairs. °· Use rubber mats in the bathroom and other areas that are often wet or slippery. °· Wear closed-toe shoes that fit well and support your feet. Wear shoes that have rubber soles or low heels. °· Review your medicines with your health care provider. Some medicines can cause dizziness or changes in blood pressure, which can increase your risk of falling. °General instructions °· Include calcium and vitamin D in your diet. Calcium is important for bone health, and vitamin D helps your body to absorb calcium. Good sources of calcium and vitamin D include: °? Certain fatty fish, such as salmon and tuna. °? Products that have calcium and vitamin D added to them (fortified products), such as fortified cereals. °? Egg yolks. °? Cheese. °? Liver. °· Take over-the-counter and prescription medicines only as told by your health care provider. °· Keep all follow-up visits as told by your health care provider. This is important. °Contact a health care provider if: °· You have never been screened for osteoporosis and you  are: °? A woman who is age 65 or older. °? A man who is age 70 or older. °Get help right away if: °· You fall or injure yourself. °Summary °· Osteoporosis is thinning and loss of density in your bones. This makes bones more brittle and fragile and more likely to break (fracture),even with minor falls. °· The goal of treatment is to strengthen your bones and reduce your risk for a fracture. °· Include calcium and vitamin D in your diet. Calcium is important for bone health, and vitamin D helps your body to absorb calcium. °· Talk with your health care provider about screening for osteoporosis if you are a woman who is age 65 or older, or a man who is age 70 or older. °This information is not intended to replace advice given to you by your health care provider. Make sure you discuss any questions you have with your health care provider. °Document Revised: 01/23/2017 Document Reviewed: 12/05/2016 °Elsevier Patient Education © 2020 [employer] Inc. ° °
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
## 2019-12-30 ENCOUNTER — Ambulatory Visit
Admission: RE | Admit: 2019-12-30 | Discharge: 2019-12-30 | Disposition: A | Discharge status: Home / Self Care | Payer: BC Managed Care – PPO | Source: Ambulatory Visit | Attending: Obstetrics and Gynecology | Admitting: Obstetrics and Gynecology

## 2019-12-30 ENCOUNTER — Other Ambulatory Visit: Payer: Self-pay

## 2019-12-30 DIAGNOSIS — Z1231 Encounter for screening mammogram for malignant neoplasm of breast: Secondary | ICD-10-CM

## 2020-02-08 IMAGING — MG DIGITAL SCREENING BILATERAL MAMMOGRAM WITH TOMO AND CAD
6 of 10 series · 6 of 30 positions shown · non-contrast
Comparison: Previous exam(s).

CLINICAL DATA: Screening.

EXAM:
DIGITAL SCREENING BILATERAL MAMMOGRAM WITH TOMO AND CAD

[R CC synth-2D (1 of 2)]
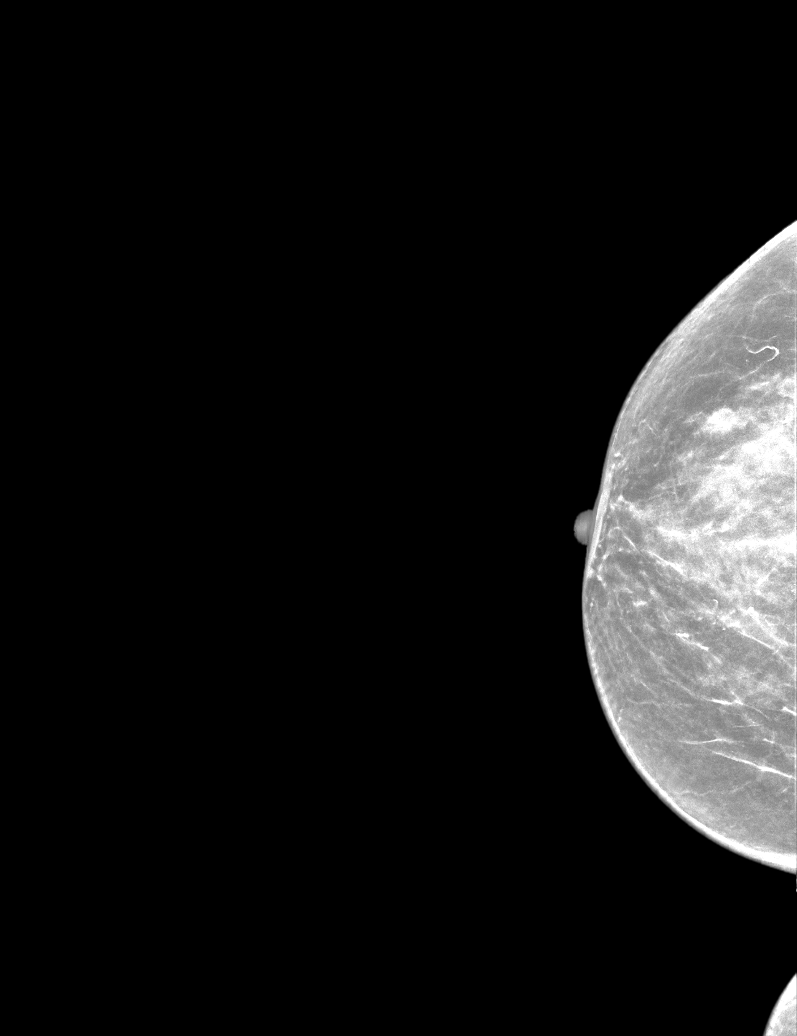

[L CC synth-2D]
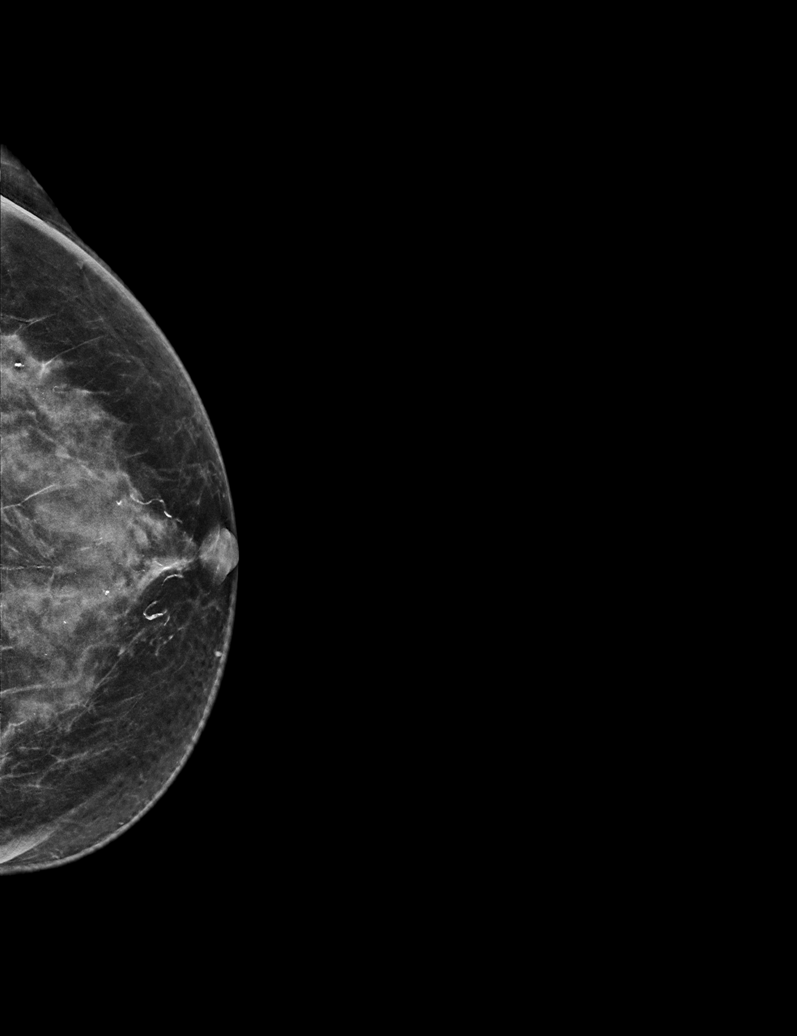

[R CC synth-2D (2 of 2)]
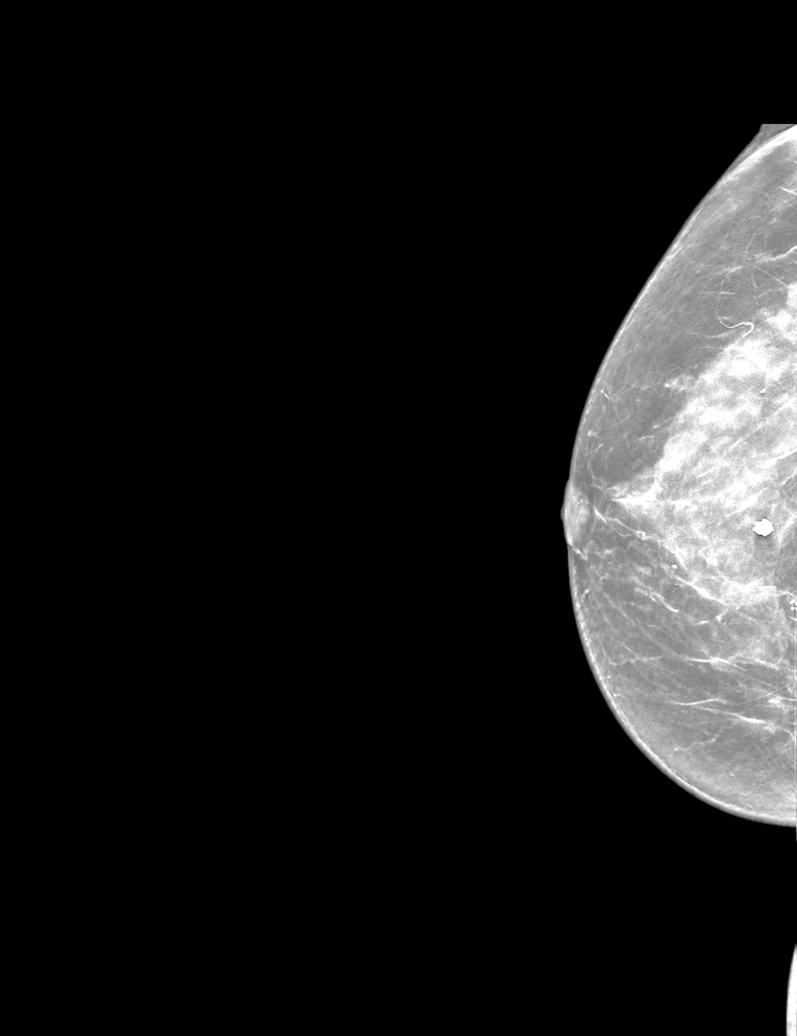

[R MLO synth-2D]
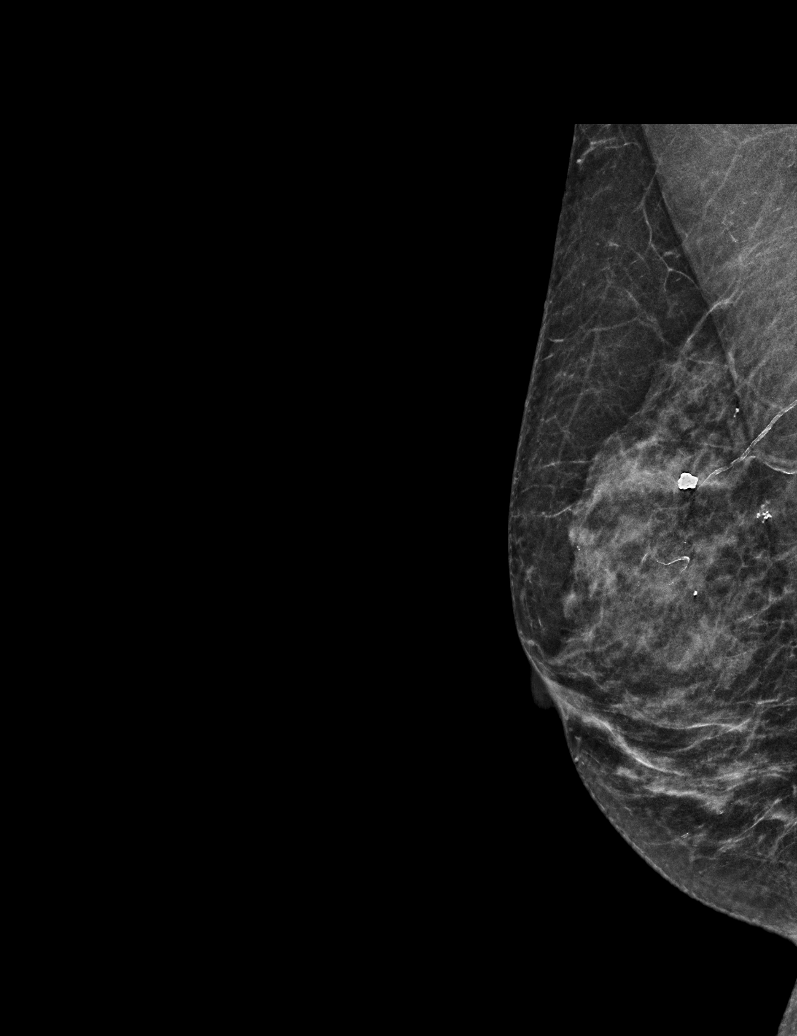

[L MLO synth-2D]
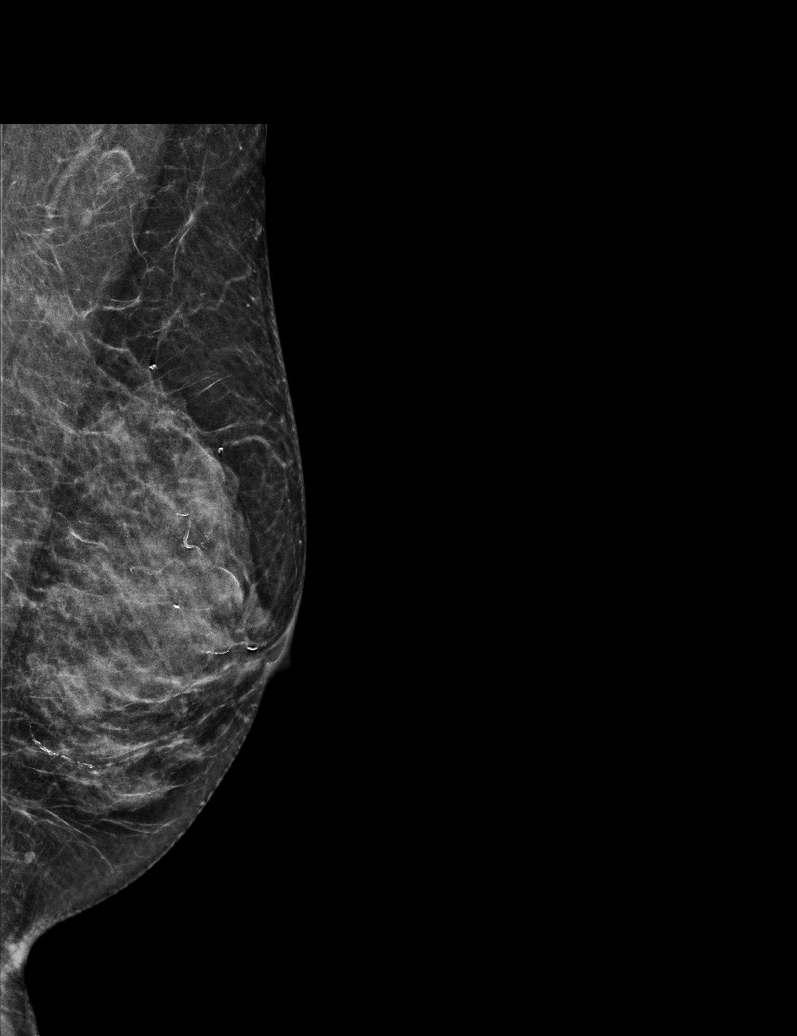

[R MLO tomo · tomo slice 23/45.0]
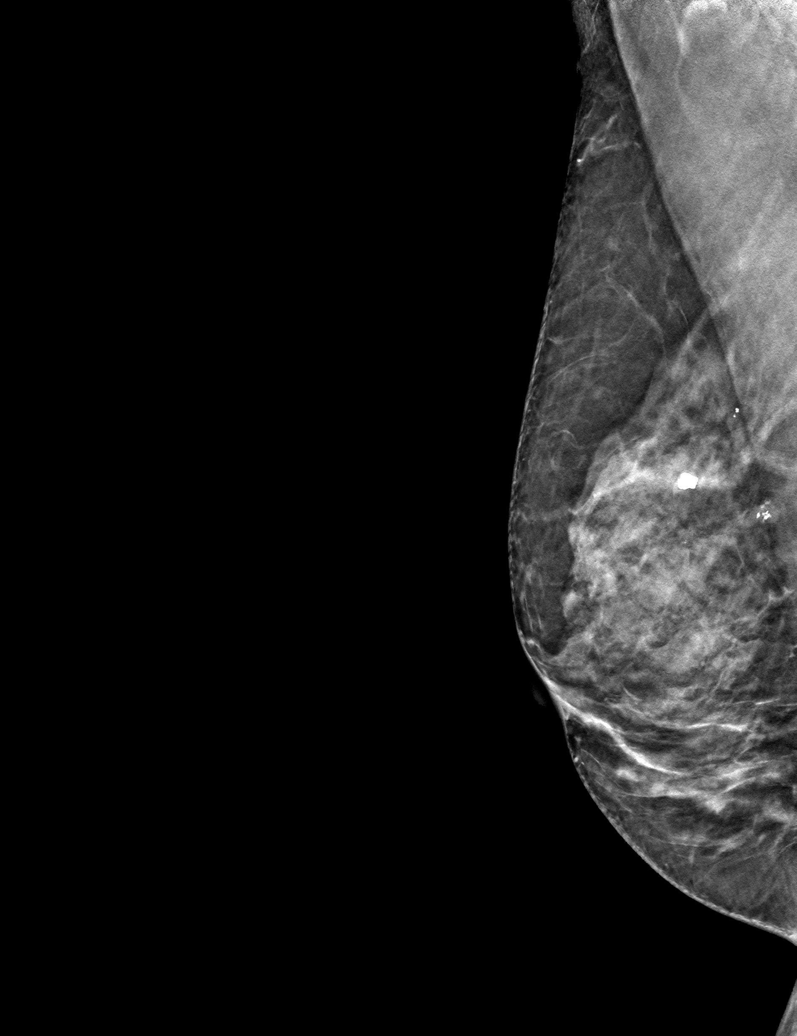

[6 of 30 positions shown; findings below may reference images not displayed]

ACR Breast Density Category c: The breast tissue is heterogeneously
dense, which may obscure small masses.
FINDINGS: There are no findings suspicious for malignancy. Images were
processed with CAD.
IMPRESSION: No mammographic evidence of malignancy. A result letter of this
screening mammogram will be mailed directly to the patient.

RECOMMENDATION:
Screening mammogram in one year. (Code:[id number])

BI-RADS CATEGORY  1: Negative.

## 2020-11-15 ENCOUNTER — Other Ambulatory Visit: Payer: Self-pay | Admitting: Obstetrics and Gynecology

## 2020-11-15 DIAGNOSIS — Z1231 Encounter for screening mammogram for malignant neoplasm of breast: Secondary | ICD-10-CM

## 2020-12-18 ENCOUNTER — Telehealth: Payer: Self-pay | Admitting: Family Medicine

## 2020-12-18 NOTE — Telephone Encounter (Signed)
Left message to get pt scheduled.

## 2020-12-18 NOTE — Telephone Encounter (Signed)
Pt requesting refill for levETIRAcetam (KEPPRA) 500 MG tablet. Pharmacy [facility], [place].

## 2020-12-19 MED ORDER — LEVETIRACETAM 500 MG PO TABS: 500.0000 mg | ORAL_TABLET | Freq: Two times a day (BID) | ORAL | 0 refills | Status: DC

## 2020-12-19 NOTE — Telephone Encounter (Signed)
Pt has scheduled her follow up, is on wait list and is now asking for her refill on her levETIRAcetam (KEPPRA) 500 MG tablet. Pharmacy [facility], [place]

## 2020-12-19 NOTE — Addendum Note (Signed)
Addended by: Darleen Crocker on: 12/19/2020 09:54 AM   Modules accepted: Orders
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
## 2020-12-19 NOTE — Telephone Encounter (Signed)
Refill sent for the pt 

## 2020-12-20 ENCOUNTER — Other Ambulatory Visit: Payer: Self-pay

## 2020-12-20 ENCOUNTER — Encounter: Payer: Self-pay | Admitting: Obstetrics and Gynecology

## 2020-12-20 ENCOUNTER — Ambulatory Visit (INDEPENDENT_AMBULATORY_CARE_PROVIDER_SITE_OTHER): Payer: BC Managed Care – PPO | Admitting: Obstetrics and Gynecology

## 2020-12-20 VITALS — BP 110/74 | HR 101 | Ht 65.5 in | Wt 117.0 lb

## 2020-12-20 DIAGNOSIS — N952 Postmenopausal atrophic vaginitis: Secondary | ICD-10-CM

## 2020-12-20 DIAGNOSIS — Z8739 Personal history of other diseases of the musculoskeletal system and connective tissue: Secondary | ICD-10-CM | POA: Diagnosis not present

## 2020-12-20 DIAGNOSIS — N898 Other specified noninflammatory disorders of vagina: Secondary | ICD-10-CM | POA: Diagnosis not present

## 2020-12-20 DIAGNOSIS — Z01419 Encounter for gynecological examination (general) (routine) without abnormal findings: Secondary | ICD-10-CM

## 2020-12-20 DIAGNOSIS — Z0000 Encounter for general adult medical examination without abnormal findings: Secondary | ICD-10-CM

## 2020-12-20 LAB — WET PREP FOR TRICH, YEAST, CLUE

## 2020-12-20 NOTE — Patient Instructions (Signed)
Try a vaginal moisturizer, Replens is a good option.   EXERCISE   We recommended that you start or continue a regular exercise program for good health. Physical activity is anything that gets your body moving, some is better than none. The CDC recommends 150 minutes per week of Moderate-Intensity Aerobic Activity and 2 or more days of Muscle Strengthening Activity.  Benefits of exercise are limitless: helps weight loss/weight maintenance, improves mood and energy, helps with depression and anxiety, improves sleep, tones and strengthens muscles, improves balance, improves bone density, protects from chronic conditions such as heart disease, high blood pressure and diabetes and so much more. To learn more visit: WhyNotPoker.uy  DIET: Good nutrition starts with a healthy diet of fruits, vegetables, whole grains, and lean protein sources. Drink plenty of water for hydration. Minimize empty calories, sodium, sweets. For more information about dietary recommendations visit: GeekRegister.com.ee and http://schaefer-mitchell.com/  ALCOHOL:  Women should limit their alcohol intake to no more than 7 drinks/beers/glasses of wine (combined, not each!) per week. Moderation of alcohol intake to this level decreases your risk of breast cancer and liver damage.  If you are concerned that you may have a problem, or your friends have told you they are concerned about your drinking, there are many resources to help. A well-known program that is free, effective, and available to all people all over the nation is Alcoholics Anonymous.  Check out this site to learn more: BlockTaxes.se   CALCIUM AND VITAMIN D:  Adequate intake of calcium and Vitamin D are recommended for bone health.  You should be getting between 1000-1200 mg of calcium and 800 units of Vitamin D daily between diet and supplements  PAP SMEARS:  Pap smears, to check for  cervical cancer or precancers,  have traditionally been done yearly, scientific advances have shown that most women can have pap smears less often.  However, every woman still should have a physical exam from her gynecologist every year. It will include a breast check, inspection of the vulva and vagina to check for abnormal growths or skin changes, a visual exam of the cervix, and then an exam to evaluate the size and shape of the uterus and ovaries. We will also provide age appropriate advice regarding health maintenance, like when you should have certain vaccines, screening for sexually transmitted diseases, bone density testing, colonoscopy, mammograms, etc.   MAMMOGRAMS:  All women over 40 years old should have a routine mammogram.   COLON CANCER SCREENING: Now recommend starting at age 92. At this time colonoscopy is not covered for routine screening until 50. There are take home tests that can be done between 45-49.   COLONOSCOPY:  Colonoscopy to screen for colon cancer is recommended for all women at age 50.  We know, you hate the idea of the prep.  We agree, BUT, having colon cancer and not knowing it is worse!!  Colon cancer so often starts as a polyp that can be seen and removed at colonscopy, which can quite literally save your life!  And if your first colonoscopy is normal and you have no family history of colon cancer, most women don't have to have it again for 10 years.  Once every ten years, you can do something that may end up saving your life, right?  We will be happy to help you get it scheduled when you are ready.  Be sure to check your insurance coverage so you understand how much it will cost.  It may be covered  as a preventative service at no cost, but you should check your particular policy.      Breast Self-Awareness Breast self-awareness means being familiar with how your breasts look and feel. It involves checking your breasts regularly and reporting any changes to your health  care provider. Practicing breast self-awareness is important. A change in your breasts can be a sign of a serious medical problem. Being familiar with how your breasts look and feel allows you to find any problems early, when treatment is more likely to be successful. All women should practice breast self-awareness, including women who have had breast implants. How to do a breast self-exam One way to learn what is normal for your breasts and whether your breasts are changing is to do a breast self-exam. To do a breast self-exam: Look for Changes  Remove all the clothing above your waist. Stand in front of a mirror in a room with good lighting. Put your hands on your hips. Push your hands firmly downward. Compare your breasts in the mirror. Look for differences between them (asymmetry), such as: Differences in shape. Differences in size. Puckers, dips, and bumps in one breast and not the other. Look at each breast for changes in your skin, such as: Redness. Scaly areas. Look for changes in your nipples, such as: Discharge. Bleeding. Dimpling. Redness. A change in position. Feel for Changes Carefully feel your breasts for lumps and changes. It is best to do this while lying on your back on the floor and again while sitting or standing in the shower or tub with soapy water on your skin. Feel each breast in the following way: Place the arm on the side of the breast you are examining above your head. Feel your breast with the other hand. Start in the nipple area and make  inch (2 cm) overlapping circles to feel your breast. Use the pads of your three middle fingers to do this. Apply light pressure, then medium pressure, then firm pressure. The light pressure will allow you to feel the tissue closest to the skin. The medium pressure will allow you to feel the tissue that is a little deeper. The firm pressure will allow you to feel the tissue close to the ribs. Continue the overlapping circles,  moving downward over the breast until you feel your ribs below your breast. Move one finger-width toward the center of the body. Continue to use the  inch (2 cm) overlapping circles to feel your breast as you move slowly up toward your collarbone. Continue the up and down exam using all three pressures until you reach your armpit.  Write Down What You Find  Write down what is normal for each breast and any changes that you find. Keep a written record with breast changes or normal findings for each breast. By writing this information down, you do not need to depend only on memory for size, tenderness, or location. Write down where you are in your menstrual cycle, if you are still menstruating. If you are having trouble noticing differences in your breasts, do not get discouraged. With time you will become more familiar with the variations in your breasts and more comfortable with the exam. How often should I examine my breasts? Examine your breasts every month. If you are breastfeeding, the best time to examine your breasts is after a feeding or after using a breast pump. If you menstruate, the best time to examine your breasts is 5-7 days after your period is over. During  your period, your breasts are lumpier, and it may be more difficult to notice changes. When should I see my health care provider? See your health care provider if you notice: A change in shape or size of your breasts or nipples. A change in the skin of your breast or nipples, such as a reddened or scaly area. Unusual discharge from your nipples. A lump or thick area that was not there before. Pain in your breasts. Anything that concerns you.

## 2020-12-21 LAB — COMPREHENSIVE METABOLIC PANEL
AG Ratio: 1.5 (calc) (ref 1.0–2.5)
ALT: 12 U/L (ref 6–29)
AST: 18 U/L (ref 10–35)
Albumin: 4.4 g/dL (ref 3.6–5.1)
Alkaline phosphatase (APISO): 104 U/L (ref 37–153)
BUN: 12 mg/dL (ref 7–25)
CO2: 31 mmol/L (ref 20–32)
Calcium: 10.2 mg/dL (ref 8.6–10.4)
Chloride: 100 mmol/L (ref 98–110)
Creat: 0.88 mg/dL (ref 0.60–0.95)
Globulin: 2.9 g/dL (calc) (ref 1.9–3.7)
Glucose, Bld: 94 mg/dL (ref 65–99)
Potassium: 4.2 mmol/L (ref 3.5–5.3)
Sodium: 139 mmol/L (ref 135–146)
Total Bilirubin: 1 mg/dL (ref 0.2–1.2)
Total Protein: 7.3 g/dL (ref 6.1–8.1)

## 2020-12-21 LAB — CBC
HCT: 41.2 % (ref 35.0–45.0)
Hemoglobin: 13.9 g/dL (ref 11.7–15.5)
MCH: 31.4 pg (ref 27.0–33.0)
MCHC: 33.7 g/dL (ref 32.0–36.0)
MCV: 93.2 fL (ref 80.0–100.0)
MPV: 10.7 fL (ref 7.5–12.5)
Platelets: 331 10*3/uL (ref 140–400)
RBC: 4.42 10*6/uL (ref 3.80–5.10)
RDW: 11.6 % (ref 11.0–15.0)
WBC: 7.5 10*3/uL (ref 3.8–10.8)

## 2020-12-21 LAB — LIPID PANEL
Cholesterol: 198 mg/dL (ref <200)
HDL: 76 mg/dL (ref >=50)
LDL Cholesterol (Calc): 101 mg/dL (calc) — ABNORMAL HIGH
Non-HDL Cholesterol (Calc): 122 mg/dL (calc) (ref <130)
Total CHOL/HDL Ratio: 2.6 (calc) (ref <5.0)
Triglycerides: 115 mg/dL (ref <150)

## 2020-12-21 LAB — VITAMIN D 25 HYDROXY (VIT D DEFICIENCY, FRACTURES): Vit D, 25-Hydroxy: 43 ng/mL (ref 30–100)

## 2020-12-24 ENCOUNTER — Other Ambulatory Visit: Payer: Self-pay | Admitting: Obstetrics and Gynecology

## 2020-12-24 DIAGNOSIS — Z8739 Personal history of other diseases of the musculoskeletal system and connective tissue: Secondary | ICD-10-CM

## 2021-01-02 ENCOUNTER — Ambulatory Visit
Admission: RE | Admit: 2021-01-02 | Discharge: 2021-01-02 | Disposition: A | Discharge status: Home / Self Care | Payer: BC Managed Care – PPO | Source: Ambulatory Visit | Attending: Obstetrics and Gynecology | Admitting: Obstetrics and Gynecology

## 2021-01-02 ENCOUNTER — Other Ambulatory Visit: Payer: Self-pay

## 2021-01-02 DIAGNOSIS — Z1231 Encounter for screening mammogram for malignant neoplasm of breast: Secondary | ICD-10-CM

## 2021-02-13 ENCOUNTER — Ambulatory Visit: Payer: BC Managed Care – PPO | Admitting: Diagnostic Neuroimaging

## 2021-02-13 ENCOUNTER — Encounter: Payer: Self-pay | Admitting: Diagnostic Neuroimaging

## 2021-02-13 VITALS — BP 147/82 | HR 105 | Ht 65.0 in | Wt 115.6 lb

## 2021-02-13 DIAGNOSIS — R569 Unspecified convulsions: Secondary | ICD-10-CM

## 2021-02-13 MED ORDER — LEVETIRACETAM 500 MG PO TABS: 500.0000 mg | ORAL_TABLET | Freq: Two times a day (BID) | ORAL | 4 refills | Status: DC
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
## 2021-03-13 ENCOUNTER — Ambulatory Visit: Payer: Self-pay | Admitting: Family Medicine

## 2021-05-31 ENCOUNTER — Other Ambulatory Visit: Payer: Self-pay | Admitting: Obstetrics and Gynecology

## 2021-05-31 DIAGNOSIS — M810 Age-related osteoporosis without current pathological fracture: Secondary | ICD-10-CM

## 2021-06-07 ENCOUNTER — Ambulatory Visit
Admission: RE | Admit: 2021-06-07 | Discharge: 2021-06-07 | Disposition: A | Discharge status: Home / Self Care | Payer: Medicare Other | Source: Ambulatory Visit | Attending: Obstetrics and Gynecology | Admitting: Obstetrics and Gynecology

## 2021-06-07 DIAGNOSIS — M810 Age-related osteoporosis without current pathological fracture: Secondary | ICD-10-CM

## 2021-06-18 ENCOUNTER — Encounter: Payer: Self-pay | Admitting: Obstetrics and Gynecology

## 2021-06-18 ENCOUNTER — Ambulatory Visit (INDEPENDENT_AMBULATORY_CARE_PROVIDER_SITE_OTHER): Payer: BC Managed Care – PPO | Admitting: Obstetrics and Gynecology

## 2021-06-18 VITALS — BP 128/82 | HR 63 | Wt 111.4 lb

## 2021-06-18 DIAGNOSIS — M810 Age-related osteoporosis without current pathological fracture: Secondary | ICD-10-CM | POA: Diagnosis not present

## 2021-06-18 MED ORDER — ALENDRONATE SODIUM 70 MG PO TABS: 70.0000 mg | ORAL_TABLET | ORAL | 3 refills | Status: DC

## 2021-06-18 NOTE — Patient Instructions (Signed)
Osteoporosis ? ?Osteoporosis happens when the bones become thin and less dense than normal. Osteoporosis makes bones more brittle and fragile and more likely to break (fracture). ?Over time, osteoporosis can cause your bones to become so weak that they fracture after a minor fall. Bones in the hip, wrist, and spine are most likely to fracture due to osteoporosis. ?What are the causes? ?The exact cause of this condition is not known. ?What increases the risk? ?You are more likely to develop this condition if you: ?Have family members with this condition. ?Have poor nutrition. ?Use the following: ?Steroid medicines, such as prednisone. ?Anti-seizure medicines. ?Nicotine or tobacco, such as cigarettes, e-cigarettes, and chewing tobacco. ?Are female. ?Are age 81 or older. ?Are not physically active (are sedentary). ?Are of European or Asian descent. ?Have a small body frame. ?What are the signs or symptoms? ?A fracture might be the first sign of osteoporosis, especially if the fracture results from a fall or injury that usually would not cause a bone to break. Other signs and symptoms include: ?Pain in the neck or low back. ?Stooped posture. ?Loss of height. ?How is this diagnosed? ?This condition may be diagnosed based on: ?Your medical history. ?A physical exam. ?A bone mineral density test, also called a DXA or DEXA test (dual-energy X-ray absorptiometry test). This test uses X-rays to measure the amount of minerals in your bones. ?How is this treated? ?This condition may be treated by: ?Making lifestyle changes, such as: ?Including foods with more calcium and vitamin D in your diet. ?Doing weight-bearing and muscle-strengthening exercises. ?Stopping tobacco use. ?Limiting alcohol intake. ?Taking medicine to slow the process of bone loss or to increase bone density. ?Taking daily supplements of calcium and vitamin D. ?Taking hormone replacement medicines, such as estrogen for women and testosterone for  men. ?Monitoring your levels of calcium and vitamin D. ?The goal of treatment is to strengthen your bones and lower your risk for a fracture. ?Follow these instructions at home: ?Eating and drinking ?Include calcium and vitamin D in your diet. Calcium is important for bone health, and vitamin D helps your body absorb calcium. Good sources of calcium and vitamin D include: ?Certain fatty fish, such as salmon and tuna. ?Products that have calcium and vitamin D added to them (are fortified), such as fortified cereals. ?Egg yolks. ?Cheese. ?Liver. ? ?Activity ?Do exercises as told by your health care provider. Ask your health care provider what exercises and activities are safe for you. You should do: ?Exercises that make you work against gravity (weight-bearing exercises), such as tai chi, yoga, or walking. ?Exercises to strengthen muscles, such as lifting weights. ?Lifestyle ?Do not drink alcohol if: ?Your health care provider tells you not to drink. ?You are pregnant, may be pregnant, or are planning to become pregnant. ?If you drink alcohol: ?Limit how much you use to: ?0-1 drink a day for women. ?0-2 drinks a day for men. ?Know how much alcohol is in your drink. In the [place] one drink equals one 12 oz bottle of beer (355 mL), one 5 oz glass of wine (148 mL), or one 1? oz glass of hard liquor (44 mL). ?Do not use any products that contain nicotine or tobacco, such as cigarettes, e-cigarettes, and chewing tobacco. If you need help quitting, ask your health care provider. ?Preventing falls ?Use devices to help you move around (mobility aids) as needed, such as canes, walkers, scooters, or crutches. ?Keep rooms well-lit and clutter-free. ?Remove tripping hazards from walkways, including cords and  throw rugs. ?Install grab bars in bathrooms and safety rails on stairs. ?Use rubber mats in the bathroom and other areas that are often wet or slippery. ?Wear closed-toe shoes that fit well and support your feet. Wear shoes  that have rubber soles or low heels. ?Review your medicines with your health care provider. Some medicines can cause dizziness or changes in blood pressure, which can increase your risk of falling. ?General instructions ?Take over-the-counter and prescription medicines only as told by your health care provider. ?Keep all follow-up visits. This is important. ?Contact a health care provider if: ?You have never been screened for osteoporosis and you are: ?A woman who is age 81 or older. ?A man who is age 21 or older. ?Get help right away if: ?You fall or injure yourself. ?Summary ?Osteoporosis is thinning and loss of density in your bones. This makes bones more brittle and fragile and more likely to break (fracture),even with minor falls. ?The goal of treatment is to strengthen your bones and lower your risk for a fracture. ?Include calcium and vitamin D in your diet. Calcium is important for bone health, and vitamin D helps your body absorb calcium. ?Talk with your health care provider about screening for osteoporosis if you are a woman who is age 81 or older, or a man who is age 81 or older. ?This information is not intended to replace advice given to you by your health care provider. Make sure you discuss any questions you have with your health care provider. ?Document Revised: 07/28/2019 Document Reviewed: 07/28/2019 ?Elsevier Patient Education ? [employer]. ? ?

## 2021-06-18 NOTE — Progress Notes (Signed)
GYNECOLOGY  VISIT ?  ?HPI: ?81 y.o.   Divorced White or Caucasian Declined  female   ?[id number] with Patient's last menstrual period was 02/25/1992.   ?here to discuss DEXA results. Recent DEXA with a T score of -3.9. Her low T score was in her forearm. Part of her spine was excluded secondary to degenerative changes. Her next lowest T score was -2.9 in her femur.  ? ?She was on Fosamax in the past for 6 months. She had an episode of coughing up blood x 1 so she stopped it.  ? ?Non smoker, no ETOH.  ? ?She is on Caltrate 600 mg a day.  ? ?Her only medication is Keppra for one possible seizure over 5 years ago.  ? ?She walks 2 miles a day. Some weights as well.  ? ?Labs from 12/20/20: ?Vit D 43 ?Creat 0.88 ?Calcium 10.2 ? 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  ? ?GYNECOLOGIC HISTORY: ?Patient's last menstrual period was 02/25/1992. ?Contraception:none  ?Menopausal hormone therapy: none  ?       ?OB History   ? ? Gravida  ?2  ? Para  ?2  ? Term  ?2  ? Preterm  ?0  ? AB  ?0  ? Living  ?2  ?  ? ? SAB  ?   ? IAB  ?   ? Ectopic  ?   ? Multiple  ?   ? Live Births  ?2  ?   ?  ?  ?    ? ?Patient Active Problem List  ? Diagnosis Date Noted  ? Benign neoplasm of colon 05/24/2019  ? Family history of colon cancer in father 05/24/2019  ? History of colon polyps 05/24/2019  ? Syncope 09/13/2015  ? BRONCHIECTASIS 07/20/2008  ? MITRAL VALVE PROLAPSE 06/14/2008  ? ALLERGIC RHINITIS 06/14/2008  ? 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 COUGH 06/14/2008  ? ? ?Past Medical History:  ?Diagnosis Date  ? Allergic rhinitis   ? 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 Diverticulosis   ? MVP (mitral valve prolapse)   ? Osteopenia   ? Intolerant of Fosamax  ? ? ?Past Surgical History:  ?Procedure Laterality Date  ? TUBAL LIGATION    ? ? ?Current Outpatient Medications  ?Medication Sig Dispense Refill  ? levETIRAcetam (KEPPRA) 500 MG tablet Take 1 tablet (500 mg total) by mouth 2 (two) times daily. 180 tablet 4  ? Multiple Vitamins-Minerals (MULTIVITAMIN PO)  Take by mouth daily as needed (for supplement).     ? ?No current facility-administered medications for this visit.  ?  ? ?ALLERGIES: Patient has no known allergies. ? ?Family History  ?Problem Relation Age of Onset  ? Stroke Mother   ? Diabetes Mother   ? Hypertension Mother   ? Colon cancer Father   ? Breast cancer Sister 49  ? ? ?Social History  ? ?Socioeconomic History  ? Marital status: Divorced  ?  Spouse name: Not on file  ? Number of children: 2  ? Years of education: 86  ? Highest education level: Not on file  ?Occupational History  ? Occupation: Trial Coordinator  ?  Comment: [employer]  ?Tobacco Use  ? Smoking status: Never  ? Smokeless tobacco: Never  ?Vaping Use  ? Vaping Use: Never used  ?Substance and Sexual Activity  ? Alcohol use: No  ? Drug use: No  ? Sexual activity: Not Currently  ?  Partners: Male  ?  Birth control/protection: Post-menopausal  ?  Other Topics Concern  ? Not on file  ?Social History Narrative  ? Lives alone  ? Caffeine use-none  ? ?Social Determinants of Health  ? ?Financial Resource Strain: Not on file  ?Food Insecurity: Not on file  ?Transportation Needs: Not on file  ?Physical Activity: Not on file  ?Stress: Not on file  ?Social Connections: Not on file  ?Intimate Partner Violence: Not on file  ? ? ?Review of Systems  ?All other systems reviewed and are negative. ? ?PHYSICAL EXAMINATION:   ? ?BP 128/82   Pulse 63   Wt 111 lb 6.4 oz (50.5 kg)   LMP 02/25/1992   SpO2 99%   BMI 18.54 kg/m?     ?General appearance: alert, cooperative and appears stated age ? ?1. Age-related osteoporosis without current pathological fracture ?Had a long discussion about osteoporosis. Discussed treatment options. Discussed the risks and side effects of fosamax, reclast and prolia. Discussed the risk of bone loss after stopping prolia and need to stay on prolia indefinitely. After answering her questions and weighing the pro's and con's, she would like to try the Fosamax.  ?-  Comprehensive metabolic panel ?- VITAMIN D 25 Hydroxy (Vit-D Deficiency, Fractures) ?- Phosphorus ?- alendronate (FOSAMAX) 70 MG tablet; Take 1 tablet (70 mg total) by mouth every 7 (seven) days. Take first thing in am with 6 oz. Water.  Be upright after taking.  Eat nothing for one hour.  Dispense: 12 tablet; Refill: 3 ?-Make her house as fall proof as possible ?-Discussed balance ?-Discussed calcium, vit d and exercise. ?-Will plan on repeating her DEXA in one year to confirm response to therapy given her severe osteoporosis ?-Given handout from up to date ? ?

## 2021-06-19 LAB — COMPREHENSIVE METABOLIC PANEL
AG Ratio: 1.4 (calc) (ref 1.0–2.5)
ALT: 13 U/L (ref 6–29)
AST: 17 U/L (ref 10–35)
Albumin: 4.2 g/dL (ref 3.6–5.1)
Alkaline phosphatase (APISO): 162 U/L — ABNORMAL HIGH (ref 37–153)
BUN: 16 mg/dL (ref 7–25)
CO2: 27 mmol/L (ref 20–32)
Calcium: 10.3 mg/dL (ref 8.6–10.4)
Chloride: 99 mmol/L (ref 98–110)
Creat: 0.93 mg/dL (ref 0.60–0.95)
Globulin: 3.1 g/dL (calc) (ref 1.9–3.7)
Glucose, Bld: 84 mg/dL (ref 65–99)
Potassium: 4.8 mmol/L (ref 3.5–5.3)
Sodium: 139 mmol/L (ref 135–146)
Total Bilirubin: 0.7 mg/dL (ref 0.2–1.2)
Total Protein: 7.3 g/dL (ref 6.1–8.1)

## 2021-06-19 LAB — PHOSPHORUS: Phosphorus: 3.2 mg/dL (ref 2.1–4.3)

## 2021-06-19 LAB — VITAMIN D 25 HYDROXY (VIT D DEFICIENCY, FRACTURES): Vit D, 25-Hydroxy: 36 ng/mL (ref 30–100)

## 2021-07-01 ENCOUNTER — Ambulatory Visit (INDEPENDENT_AMBULATORY_CARE_PROVIDER_SITE_OTHER): Payer: Medicare Other | Admitting: Family Medicine

## 2021-07-01 ENCOUNTER — Encounter: Payer: Self-pay | Admitting: Family Medicine

## 2021-07-01 VITALS — BP 116/72 | HR 95 | Ht 65.5 in | Wt 110.0 lb

## 2021-07-01 DIAGNOSIS — E640 Sequelae of protein-calorie malnutrition: Secondary | ICD-10-CM

## 2021-07-01 DIAGNOSIS — J479 Bronchiectasis, uncomplicated: Secondary | ICD-10-CM

## 2021-07-01 DIAGNOSIS — R7301 Impaired fasting glucose: Secondary | ICD-10-CM | POA: Diagnosis not present

## 2021-07-01 DIAGNOSIS — R55 Syncope and collapse: Secondary | ICD-10-CM

## 2021-07-01 DIAGNOSIS — Z8669 Personal history of other diseases of the nervous system and sense organs: Secondary | ICD-10-CM

## 2021-07-01 DIAGNOSIS — E559 Vitamin D deficiency, unspecified: Secondary | ICD-10-CM

## 2021-07-01 DIAGNOSIS — Z8719 Personal history of other diseases of the digestive system: Secondary | ICD-10-CM

## 2021-07-01 DIAGNOSIS — R21 Rash and other nonspecific skin eruption: Secondary | ICD-10-CM

## 2021-07-01 NOTE — Assessment & Plan Note (Signed)
Continue using OTC treatments and call if symptoms worsen.  ?

## 2021-07-01 NOTE — Patient Instructions (Signed)
I appreciate the opportunity to provide care to you today! ?  ?Follow up: 6 months ? ? labs: please stop by the lab to have your blood drawn today ? ? ? ? ?  ?It was a pleasure to see you and I look forward to continuing to work together on your health and well-being. ?Please do not hesitate to call the office if you need care or have questions about your care. ?  ?Have a wonderful day and week. ?With Gratitude, ?[name] MSN, FNP-BC  ?

## 2021-07-01 NOTE — Assessment & Plan Note (Signed)
Continue treatment regimen ?

## 2021-07-01 NOTE — Progress Notes (Signed)
? ?New Patient Office Visit ? ?Subjective:  ?Patient ID: Heidi Vasquez, female    DOB: [birth year]  Age: 81 y.o. MRN: [id number] ? ?CC:  ?Chief Complaint  ?Patient presents with  ? New Patient (Initial Visit)  ?  Pt would like to establish care, states she recently started taking fosamax.   ? ? ?HPI ?Heidi Vasquez is a 81 y.o. female with past medical history of mild COPD, cough syncope, and bronchiectasis presents for establishing care. She reports having her first shingles vaccine at the [facility] but has not had her second vaccine. She has severe osteoporosis, for which she takes Fosamax with no complaints. She reports walking 2 miles daily and taking Keppra for one possible seizure over 5 years ago. She complains of pruritic rash on her upper back, shoulders, and neck, for which she uses OTC calamine lotion and itch cream. She reports moderate relief with OTC treatments and is currently declining Kenalog cream. She notes that if it worsens, she will give [place] a call.  ?Past Medical History:  ?Diagnosis Date  ? Allergic rhinitis   ? 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 Diverticulosis   ? MVP (mitral valve prolapse)   ? Osteopenia   ? Intolerant of Fosamax  ? ? ?Past Surgical History:  ?Procedure Laterality Date  ? TUBAL LIGATION    ? ? ?Family History  ?Problem Relation Age of Onset  ? Stroke Mother   ? Diabetes Mother   ? Hypertension Mother   ? Colon cancer Father   ? Breast cancer Sister 17  ? ? ?Social History  ? ?Socioeconomic History  ? Marital status: Divorced  ?  Spouse name: Not on file  ? Number of children: 2  ? Years of education: 54  ? Highest education level: Not on file  ?Occupational History  ? Occupation: Trial Coordinator  ?  Comment: [employer]  ?Tobacco Use  ? Smoking status: Never  ? Smokeless tobacco: Never  ?Vaping Use  ? Vaping Use: Never used  ?Substance and Sexual Activity  ? Alcohol use: No  ? Drug use: No  ? Sexual activity: Not Currently  ?  Partners: Male  ?  Birth  control/protection: Post-menopausal  ?Other Topics Concern  ? Not on file  ?Social History Narrative  ? Lives alone  ? Caffeine use-none  ? ?Social Determinants of Health  ? ?Financial Resource Strain: Not on file  ?Food Insecurity: Not on file  ?Transportation Needs: Not on file  ?Physical Activity: Not on file  ?Stress: Not on file  ?Social Connections: Not on file  ?Intimate Partner Violence: Not on file  ? ? ?ROS ?Review of Systems  ?Constitutional:  Negative for chills, fatigue and fever.  ?HENT:  Negative for sinus pressure, sinus pain, sneezing and sore throat.   ?Eyes:  Negative for pain, redness and itching.  ?Gastrointestinal:  Negative for constipation, diarrhea, rectal pain and vomiting.  ?Endocrine: Negative for polydipsia, polyphagia and polyuria.  ?Genitourinary:  Negative for frequency and urgency.  ?Musculoskeletal:  Negative for back pain and neck pain.  ?Skin:  Positive for rash. Negative for wound.  ?Neurological:  Negative for dizziness, weakness, numbness and headaches.  ?Psychiatric/Behavioral:  Negative for confusion and sleep disturbance.   ? ?Objective:  ? ?Today's Vitals: BP 116/72   Pulse 95   Ht 5' 5.5" (1.664 m)   Wt 110 lb (49.9 kg)   LMP 02/25/1992   SpO2 98%   BMI 18.03 kg/m?  ? ?Physical Exam ?  Constitutional:   ?   Appearance: Normal appearance.  ?HENT:  ?   Head: Normocephalic.  ?   Right Ear: External ear normal.  ?   Left Ear: External ear normal.  ?   Nose: No congestion or rhinorrhea.  ?   Mouth/Throat:  ?   Mouth: Mucous membranes are moist.  ?Eyes:  ?   Extraocular Movements: Extraocular movements intact.  ?   Pupils: Pupils are equal, round, and reactive to light.  ?Cardiovascular:  ?   Rate and Rhythm: Normal rate and regular rhythm.  ?   Pulses: Normal pulses.  ?   Heart sounds: Normal heart sounds.  ?Pulmonary:  ?   Effort: Pulmonary effort is normal.  ?   Breath sounds: Normal breath sounds. No wheezing or rhonchi.  ?Abdominal:  ?   Palpations: Abdomen is soft.  ?    Tenderness: There is no right CVA tenderness.  ?Musculoskeletal:  ?   Cervical back: No rigidity.  ?Lymphadenopathy:  ?   Cervical: No cervical adenopathy.  ?Skin: ?   General: Skin is warm.  ?   Findings: Rash (upper back and neck) present.  ?Neurological:  ?   Mental Status: She is alert and oriented to person, place, and time.  ?   Motor: No weakness.  ?   Gait: Gait normal.  ?Psychiatric:  ?   Comments: Normal affect  ? ? ?Assessment & Plan:  ? ?Problem List Items Addressed This Visit   ? ?  ? Respiratory  ? BRONCHIECTASIS  ?  ? Musculoskeletal and Integument  ? Rash  ?  Continue using OTC treatments and call if symptoms worsen.  ? ?  ?  ?  ? Other  ? Syncope  ? Relevant Orders  ? CBC with Differential/Platelet  ? CMP14+EGFR  ? TSH + free T4  ? Hx of seizure disorder  ?  Continue treatment regimen ? ?  ?  ? ?Other Visit Diagnoses   ? ? IFG (impaired fasting glucose)    -  Primary  ? Relevant Orders  ? Hemoglobin A1c  ? Vitamin D deficiency      ? Relevant Orders  ? Vitamin D (25 hydroxy)  ? History of diverticulitis      ? Relevant Orders  ? Lipid panel  ? 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 Relevant Orders  ? Lipid panel  ? ?  ? ? ?Outpatient Encounter Medications as of 07/01/2021  ?Medication Sig  ? alendronate (FOSAMAX) 70 MG tablet Take 1 tablet (70 mg total) by mouth every 7 (seven) days. Take first thing in am with 6 oz. Water.  Be upright after taking.  Eat nothing for one hour.  ? levETIRAcetam (KEPPRA) 500 MG tablet Take 1 tablet (500 mg total) by mouth 2 (two) times daily.  ? Multiple Vitamins-Minerals (MULTIVITAMIN PO) Take by mouth daily as needed (for supplement).   ? ?No facility-administered encounter medications on file as of 07/01/2021.  ? ? ?Follow-up: Return in about 6 months (around 01/01/2022).  ? ?[name], FNP ?

## 2021-07-02 LAB — CBC WITH DIFFERENTIAL/PLATELET
Basophils Absolute: 0 10*3/uL (ref 0.0–0.2)
Basos: 0 %
EOS (ABSOLUTE): 0 10*3/uL (ref 0.0–0.4)
Eos: 0 %
Hematocrit: 41.4 % (ref 34.0–46.6)
Hemoglobin: 14 g/dL (ref 11.1–15.9)
Immature Grans (Abs): 0 10*3/uL (ref 0.0–0.1)
Immature Granulocytes: 0 %
Lymphocytes Absolute: 1.8 10*3/uL (ref 0.7–3.1)
Lymphs: 17 %
MCH: 31.3 pg (ref 26.6–33.0)
MCHC: 33.8 g/dL (ref 31.5–35.7)
MCV: 92 fL (ref 79–97)
Monocytes Absolute: 0.5 10*3/uL (ref 0.1–0.9)
Monocytes: 5 %
Neutrophils Absolute: 8.1 10*3/uL — ABNORMAL HIGH (ref 1.4–7.0)
Neutrophils: 78 %
Platelets: 288 10*3/uL (ref 150–450)
RBC: 4.48 x10E6/uL (ref 3.77–5.28)
RDW: 12.3 % (ref 11.7–15.4)
WBC: 10.5 10*3/uL (ref 3.4–10.8)

## 2021-07-02 LAB — CMP14+EGFR
ALT: 13 IU/L (ref 0–32)
AST: 18 IU/L (ref 0–40)
Albumin/Globulin Ratio: 1.3 (ref 1.2–2.2)
Albumin: 4.3 g/dL (ref 3.7–4.7)
Alkaline Phosphatase: 152 IU/L — ABNORMAL HIGH (ref 44–121)
BUN/Creatinine Ratio: 14 (ref 12–28)
BUN: 13 mg/dL (ref 8–27)
Bilirubin Total: 0.6 mg/dL (ref 0.0–1.2)
CO2: 27 mmol/L (ref 20–29)
Calcium: 9.6 mg/dL (ref 8.7–10.3)
Chloride: 100 mmol/L (ref 96–106)
Creatinine, Ser: 0.95 mg/dL (ref 0.57–1.00)
Globulin, Total: 3.2 g/dL (ref 1.5–4.5)
Glucose: 100 mg/dL — ABNORMAL HIGH (ref 70–99)
Potassium: 4.9 mmol/L (ref 3.5–5.2)
Sodium: 140 mmol/L (ref 134–144)
Total Protein: 7.5 g/dL (ref 6.0–8.5)
eGFR: 61 mL/min/{1.73_m2} (ref >59)

## 2021-07-02 LAB — LIPID PANEL
Chol/HDL Ratio: 2.6 ratio (ref 0.0–4.4)
Cholesterol, Total: 187 mg/dL (ref 100–199)
HDL: 71 mg/dL (ref >39)
LDL Chol Calc (NIH): 95 mg/dL (ref 0–99)
Triglycerides: 122 mg/dL (ref 0–149)
VLDL Cholesterol Cal: 21 mg/dL (ref 5–40)

## 2021-07-02 LAB — HEMOGLOBIN A1C
Est. average glucose Bld gHb Est-mCnc: 126 mg/dL
Hgb A1c MFr Bld: 6 % — ABNORMAL HIGH (ref 4.8–5.6)

## 2021-07-02 LAB — TSH+FREE T4
Free T4: 1.33 ng/dL (ref 0.82–1.77)
TSH: 1.84 u[IU]/mL (ref 0.450–4.500)

## 2021-07-02 LAB — VITAMIN D 25 HYDROXY (VIT D DEFICIENCY, FRACTURES): Vit D, 25-Hydroxy: 35.7 ng/mL (ref 30.0–100.0)

## 2021-07-02 NOTE — Progress Notes (Signed)
Her HgA1c shows that she has prediabetes. I recommend lifestyle changes, and we will reassess at her next appt. Lifestyle changes include weight loss and exercising at least 3 days a week for 150 min weekly. I recommend decreasing carbs and food high in sugar from her diet.  ? ?All other labs were normal

## 2021-10-29 ENCOUNTER — Ambulatory Visit (INDEPENDENT_AMBULATORY_CARE_PROVIDER_SITE_OTHER): Payer: Medicare Other

## 2021-10-29 DIAGNOSIS — Z0000 Encounter for general adult medical examination without abnormal findings: Secondary | ICD-10-CM

## 2021-10-29 NOTE — Patient Instructions (Signed)
Heidi Vasquez , Thank you for taking time to come for your Medicare Wellness Visit. I appreciate your ongoing commitment to your health goals. Please review the following plan we discussed and let me know if I can assist you in the future.   Screening recommendations/referrals: Bone Density: Completed Recommended yearly ophthalmology/optometry visit for glaucoma screening and checkup Recommended yearly dental visit for hygiene and checkup  Vaccinations: Influenza vaccine: Due Pneumococcal vaccine: Due Tdap vaccine: Completed Shingles vaccine: Due    Advanced directives: Bring a copy to put on file at your next appt  Conditions/risks identified: Falls  Next appointment: 1 year   Preventive Care 47 Years and Older, Female Preventive care refers to lifestyle choices and visits with your health care provider that can promote health and wellness. What does preventive care include? A yearly physical exam. This is also called an annual well check. Dental exams once or twice a year. Routine eye exams. Ask your health care provider how often you should have your eyes checked. Personal lifestyle choices, including: Daily care of your teeth and gums. Regular physical activity. Eating a healthy diet. Avoiding tobacco and drug use. Limiting alcohol use. Practicing safe sex. Taking low-dose aspirin every day. Taking vitamin and mineral supplements as recommended by your health care provider. What happens during an annual well check? The services and screenings done by your health care provider during your annual well check will depend on your age, overall health, lifestyle risk factors, and family history of disease. Counseling  Your health care provider may ask you questions about your: Alcohol use. Tobacco use. Drug use. Emotional well-being. Home and relationship well-being. Sexual activity. Eating habits. History of falls. Memory and ability to understand (cognition). Work and work  Statistician. Reproductive health. Screening  You may have the following tests or measurements: Height, weight, and BMI. Blood pressure. Lipid and cholesterol levels. These may be checked every 5 years, or more frequently if you are over 23 years old. Skin check. Lung cancer screening. You may have this screening every year starting at age 83 if you have a 30-pack-year history of smoking and currently smoke or have quit within the past 15 years. Fecal occult blood test (FOBT) of the stool. You may have this test every year starting at age 43. Flexible sigmoidoscopy or colonoscopy. You may have a sigmoidoscopy every 5 years or a colonoscopy every 10 years starting at age 41. Hepatitis C blood test. Hepatitis B blood test. Sexually transmitted disease (STD) testing. Diabetes screening. This is done by checking your blood sugar (glucose) after you have not eaten for a while (fasting). You may have this done every 1-3 years. Bone density scan. This is done to screen for osteoporosis. You may have this done starting at age 26. Mammogram. This may be done every 1-2 years. Talk to your health care provider about how often you should have regular mammograms. Talk with your health care provider about your test results, treatment options, and if necessary, the need for more tests. Vaccines  Your health care provider may recommend certain vaccines, such as: Influenza vaccine. This is recommended every year. Tetanus, diphtheria, and acellular pertussis (Tdap, Td) vaccine. You may need a Td booster every 10 years. Zoster vaccine. You may need this after age 38. Pneumococcal 13-valent conjugate (PCV13) vaccine. One dose is recommended after age 47. Pneumococcal polysaccharide (PPSV23) vaccine. One dose is recommended after age 8. Talk to your health care provider about which screenings and vaccines you need and how often  you need them. This information is not intended to replace advice given to you by  your health care provider. Make sure you discuss any questions you have with your health care provider. Document Released: 03/09/2015 Document Revised: 10/31/2015 Document Reviewed: 12/12/2014 Elsevier Interactive Patient Education  2017 [place] Prevention in the Home Falls can cause injuries. They can happen to people of all ages. There are many things you can do to make your home safe and to help prevent falls. What can I do on the outside of my home? Regularly fix the edges of walkways and driveways and fix any cracks. Remove anything that might make you trip as you walk through a door, such as a raised step or threshold. Trim any bushes or trees on the path to your home. Use bright outdoor lighting. Clear any walking paths of anything that might make someone trip, such as rocks or tools. Regularly check to see if handrails are loose or broken. Make sure that both sides of any steps have handrails. Any raised decks and porches should have guardrails on the edges. Have any leaves, snow, or ice cleared regularly. Use sand or salt on walking paths during winter. Clean up any spills in your garage right away. This includes oil or grease spills. What can I do in the bathroom? Use night lights. Install grab bars by the toilet and in the tub and shower. Do not use towel bars as grab bars. Use non-skid mats or decals in the tub or shower. If you need to sit down in the shower, use a plastic, non-slip stool. Keep the floor dry. Clean up any water that spills on the floor as soon as it happens. Remove soap buildup in the tub or shower regularly. Attach bath mats securely with double-sided non-slip rug tape. Do not have throw rugs and other things on the floor that can make you trip. What can I do in the bedroom? Use night lights. Make sure that you have a light by your bed that is easy to reach. Do not use any sheets or blankets that are too big for your bed. They should not hang  down onto the floor. Have a firm chair that has side arms. You can use this for support while you get dressed. Do not have throw rugs and other things on the floor that can make you trip. What can I do in the kitchen? Clean up any spills right away. Avoid walking on wet floors. Keep items that you use a lot in easy-to-reach places. If you need to reach something above you, use a strong step stool that has a grab bar. Keep electrical cords out of the way. Do not use floor polish or wax that makes floors slippery. If you must use wax, use non-skid floor wax. Do not have throw rugs and other things on the floor that can make you trip. What can I do with my stairs? Do not leave any items on the stairs. Make sure that there are handrails on both sides of the stairs and use them. Fix handrails that are broken or loose. Make sure that handrails are as long as the stairways. Check any carpeting to make sure that it is firmly attached to the stairs. Fix any carpet that is loose or worn. Avoid having throw rugs at the top or bottom of the stairs. If you do have throw rugs, attach them to the floor with carpet tape. Make sure that you have a light  switch at the top of the stairs and the bottom of the stairs. If you do not have them, ask someone to add them for you. What else can I do to help prevent falls? Wear shoes that: Do not have high heels. Have rubber bottoms. Are comfortable and fit you well. Are closed at the toe. Do not wear sandals. If you use a stepladder: Make sure that it is fully opened. Do not climb a closed stepladder. Make sure that both sides of the stepladder are locked into place. Ask someone to hold it for you, if possible. Clearly mark and make sure that you can see: Any grab bars or handrails. First and last steps. Where the edge of each step is. Use tools that help you move around (mobility aids) if they are needed. These  include: Canes. Walkers. Scooters. Crutches. Turn on the lights when you go into a dark area. Replace any light bulbs as soon as they burn out. Set up your furniture so you have a clear path. Avoid moving your furniture around. If any of your floors are uneven, fix them. If there are any pets around you, be aware of where they are. Review your medicines with your doctor. Some medicines can make you feel dizzy. This can increase your chance of falling. Ask your doctor what other things that you can do to help prevent falls. This information is not intended to replace advice given to you by your health care provider. Make sure you discuss any questions you have with your health care provider. Document Released: 12/07/2008 Document Revised: 07/19/2015 Document Reviewed: 03/17/2014 Elsevier Interactive Patient Education  2017 [employer].

## 2021-10-29 NOTE — Progress Notes (Addendum)
Subjective:   Heidi Vasquez is a 81 y.o. female who presents for Medicare Annual (Subsequent) preventive examination. I connected with  Heidi Vasquez on 10/29/21 by a audio enabled telemedicine application and verified that I am speaking with the correct person using two identifiers.  Patient Location: Home  Provider Location: Office/Clinic  I discussed the limitations of evaluation and management by telemedicine. The patient expressed understanding and agreed to proceed.   Review of Systems     Heidi Vasquez , Thank you for taking time to come for your Medicare Wellness Visit. I appreciate your ongoing commitment to your health goals. Please review the following plan we discussed and let me know if I can assist you in the future.   These are the goals we discussed:  Goals   None     This is a list of the screening recommended for you and due dates:  Health Maintenance  Topic Date Due   COVID-19 Vaccine (1) Never done   Zoster (Shingles) Vaccine (1 of 2) Never done   Pneumonia Vaccine (2 - PCV) 02/25/2008   Flu Shot  09/24/2021   Tetanus Vaccine  10/22/2023   DEXA scan (bone density measurement)  Completed   HPV Vaccine  Aged Out          Objective:    There were no vitals filed for this visit. There is no height or weight on file to calculate BMI.     12/19/2015   11:27 AM 06/15/2015   11:34 AM 04/20/2015   10:37 AM  Advanced Directives  Does Patient Have a Medical Advance Directive? Yes Yes Yes  Type of Advance Directive Living will;Healthcare Power of [place];Living will [place];Living will  Copy of [place] in Chart? No - copy requested  No - copy requested    Current Medications (verified) Outpatient Encounter Medications as of 10/29/2021  Medication Sig   alendronate (FOSAMAX) 70 MG tablet Take 1 tablet (70 mg total) by mouth every 7 (seven) days. Take first thing in am with 6 oz. Water.  Be  upright after taking.  Eat nothing for one hour.   levETIRAcetam (KEPPRA) 500 MG tablet Take 1 tablet (500 mg total) by mouth 2 (two) times daily.   Multiple Vitamins-Minerals (MULTIVITAMIN PO) Take by mouth daily as needed (for supplement).    No facility-administered encounter medications on file as of 10/29/2021.    Allergies (verified) Patient has no known allergies.   History: Past Medical History:  Diagnosis Date   Allergic rhinitis    Convulsions (HCC)    Diverticulosis    MVP (mitral valve prolapse)    Osteopenia    Intolerant of Fosamax   Past Surgical History:  Procedure Laterality Date   TUBAL LIGATION     Family History  Problem Relation Age of Onset   Stroke Mother    Diabetes Mother    Hypertension Mother    Colon cancer Father    Breast cancer Sister 46   Social History   Socioeconomic History   Marital status: Divorced    Spouse name: Not on file   Number of children: 2   Years of education: 16   Highest education level: Not on file  Occupational History   Occupation: Trial Coordinator    Comment: [employer]  Tobacco Use   Smoking status: Never   Smokeless tobacco: Never  Vaping Use   Vaping Use: Never used  Substance and Sexual Activity   Alcohol use: No   Drug use: No   Sexual activity: Not Currently    Partners: Male    Birth control/protection: Post-menopausal  Other Topics Concern   Not on file  Social History Narrative   Lives alone   Caffeine use-none   Social Determinants of Health   Financial Resource Strain: Not on file  Food Insecurity: Not on file  Transportation Needs: Not on file  Physical Activity: Not on file  Stress: Not on file  Social Connections: Not on file    Tobacco Counseling Counseling given: Not Answered   Clinical Intake:              How often do you need to have someone help you when you read instructions, pamphlets, or other written materials from your doctor or pharmacy?: (P) 1  - Never  Diabetic? No         Activities of Daily Living    10/28/2021    3:24 PM  In your present state of health, do you have any difficulty performing the following activities:  Hearing? 0  Vision? 0  Difficulty concentrating or making decisions? 0  Walking or climbing stairs? 0  Dressing or bathing? 0  Doing errands, shopping? 0  Preparing Food and eating ? N  Using the Toilet? N  In the past six months, have you accidently leaked urine? N  Do you have problems with loss of bowel control? N  Managing your Medications? N  Managing your Finances? N  Housekeeping or managing your Housekeeping? N    Patient Care Team: [name], [place] as PCP - General (Family Medicine) Salvadore Dom, MD as Consulting Physician (Obstetrics and Gynecology)  Indicate any recent Medical Services you may have received from other than [facility] providers in the past year (date may be approximate).     Assessment:   This is a routine wellness examination for [place].  Hearing/Vision screen No results found.  Dietary issues and exercise activities discussed:     Goals Addressed   None   Depression Screen    07/01/2021   11:16 AM  PHQ 2/9 Scores  PHQ - 2 Score 0    Fall Risk    10/28/2021    3:24 PM 07/01/2021   11:16 AM 02/13/2021    2:18 PM 08/25/2018    2:45 PM 12/19/2015   11:21 AM  [place] in the past year? 0 0 0 0 No  Number falls in past yr: 0 0     Injury with Fall? 0 0     Risk for fall due to :  No Fall Risks     Follow up  Falls evaluation completed       [place]:  Any stairs in or around the home? Yes  If so, are there any without handrails? No  Home free of loose throw rugs in walkways, pet beds, electrical cords, etc? Yes  Adequate lighting in your home to reduce risk of falls? Yes   ASSISTIVE DEVICES UTILIZED TO PREVENT FALLS:  Life alert? No  Use of a cane, walker or w/c? No  Grab bars in the bathroom?  No  Shower chair or bench in shower? No  Elevated toilet seat or a handicapped toilet? No   Cognitive Function:        Immunizations Immunization History  Administered Date(s) Administered   Influenza Whole 12/29/2008, 11/24/2009  Influenza, High Dose Seasonal PF 11/15/2018   Pneumococcal Polysaccharide-23 02/25/2007   Tdap 10/21/2013    TDAP status: Up to date  Flu Vaccine status: Due, Education has been provided regarding the importance of this vaccine. Advised may receive this vaccine at local pharmacy or Health Dept. Aware to provide a copy of the vaccination record if obtained from local pharmacy or Health Dept. Verbalized acceptance and understanding.  Pneumococcal vaccine status: Due, Education has been provided regarding the importance of this vaccine. Advised may receive this vaccine at local pharmacy or Health Dept. Aware to provide a copy of the vaccination record if obtained from local pharmacy or Health Dept. Verbalized acceptance and understanding.  Covid-19 vaccine status: Declined, Education has been provided regarding the importance of this vaccine but patient still declined. Advised may receive this vaccine at local pharmacy or Health Dept.or vaccine clinic. Aware to provide a copy of the vaccination record if obtained from local pharmacy or Health Dept. Verbalized acceptance and understanding.  Qualifies for Shingles Vaccine? Yes   Zostavax completed No   Shingrix Completed?: No.    Education has been provided regarding the importance of this vaccine. Patient has been advised to call insurance company to determine out of pocket expense if they have not yet received this vaccine. Advised may also receive vaccine at local pharmacy or Health Dept. Verbalized acceptance and understanding.  Screening Tests Health Maintenance  Topic Date Due   COVID-19 Vaccine (1) Never done   Zoster Vaccines- Shingrix (1 of 2) Never done   Pneumonia Vaccine 39+ Years old (2 - PCV)  02/25/2008   INFLUENZA VACCINE  09/24/2021   TETANUS/TDAP  10/22/2023   DEXA SCAN  Completed   HPV VACCINES  Aged Out    Health Maintenance  Health Maintenance Due  Topic Date Due   COVID-19 Vaccine (1) Never done   Zoster Vaccines- Shingrix (1 of 2) Never done   Pneumonia Vaccine 21+ Years old (2 - PCV) 02/25/2008   INFLUENZA VACCINE  09/24/2021    Colorectal cancer screening: No longer required.   Mammogram status: No longer required due to age.  Bone Density status: Completed 06/07/2021. Results reflect: Bone density results: OSTEOPOROSIS. Repeat every 3 years.  Lung Cancer Screening: (Low Dose CT Chest recommended if Age 61-80 years, 30 pack-year currently smoking OR have quit w/in 15years.) does not qualify.     Additional Screening:  Hepatitis C Screening: does qualify; Completed   Vision Screening: Recommended annual ophthalmology exams for early detection of glaucoma and other disorders of the eye. Is the patient up to date with their annual eye exam?  No  Who is the provider or what is the name of the office in which the patient attends annual eye exams? [facility]  Dental Screening: Recommended annual dental exams for proper oral hygiene  Community Resource Referral / Chronic Care Management: CRR required this visit?  No   CCM required this visit?  No      Plan:     I have personally reviewed and noted the following in the patient's chart:   Medical and social history Use of alcohol, tobacco or illicit drugs  Current medications and supplements including opioid prescriptions. Patient is not currently taking opioid prescriptions. Functional ability and status Nutritional status Physical activity Advanced directives List of other physicians Hospitalizations, surgeries, and ER visits in previous 12 months Vitals Screenings to include cognitive, depression, and falls Referrals and appointments  In addition, I have reviewed and  discussed with  patient certain preventive protocols, quality metrics, and best practice recommendations. A written personalized care plan for preventive services as well as general preventive health recommendations were provided to patient.     Johny Drilling, [place]   10/29/2021   Nurse Notes:  Ms. Havener , Thank you for taking time to come for your Medicare Wellness Visit. I appreciate your ongoing commitment to your health goals. Please review the following plan we discussed and let me know if I can assist you in the future.   These are the goals we discussed:  Goals   None     This is a list of the screening recommended for you and due dates:  Health Maintenance  Topic Date Due   COVID-19 Vaccine (1) Never done   Zoster (Shingles) Vaccine (1 of 2) Never done   Pneumonia Vaccine (2 - PCV) 02/25/2008   Flu Shot  09/24/2021   Tetanus Vaccine  10/22/2023   DEXA scan (bone density measurement)  Completed   HPV Vaccine  Aged Out

## 2021-12-09 ENCOUNTER — Other Ambulatory Visit: Payer: Self-pay | Admitting: Obstetrics and Gynecology

## 2021-12-09 DIAGNOSIS — Z1231 Encounter for screening mammogram for malignant neoplasm of breast: Secondary | ICD-10-CM

## 2021-12-23 ENCOUNTER — Ambulatory Visit: Payer: BC Managed Care – PPO | Admitting: Obstetrics and Gynecology

## 2021-12-24 NOTE — Progress Notes (Signed)
81 y.o. G64P2002 Divorced White or Caucasian Declined female here for annual exam.  No vaginal bleeding. No bowel or bladder issues.    DEXA from 06/07/21 with a T score of -3.9. Her low T score was in her forearm. Part of her spine was excluded secondary to degenerative changes. Her next lowest T score was -2.9 in her femur. She was started on Fosamax in 4/23 (started in 5/23).  No side effects.  She is getting calcium and vit. Exercises regularly.    Her only medication is Keppra for one possible seizure over 5 years ago.    She walks 2 miles a day. Some weights as well.   Patient's last menstrual period was 02/25/1992.          Sexually active: No.  The current method of family planning is post menopausal status.    Exercising: Yes.     Walking daily weight bearing   Smoker:  no  Health Maintenance Pap:  11/12/2015 normal with negative HPV, 07/27/2009 normal History of abnormal Pap:  no MMG:  01/03/21 density C Bi-rads 1 neg  BMD:   06/07/21 osteoporotic Colonoscopy:  07/07/19: Polyp, f/u in 5 years recommended, patient declines TDaP:  2015 Gardasil: n/a   reports that she has never smoked. She has never used smokeless tobacco. She reports that she does not drink alcohol and does not use drugs. Retired from the [employer] earlier this year, had been there for 42 years. Kids are local, 3 grandchildren.   Past Medical History:  Diagnosis Date   Allergic rhinitis    Convulsions (HCC)    Diverticulosis    Heart murmur    MVP (mitral valve prolapse)    Osteopenia    Intolerant of Fosamax    Past Surgical History:  Procedure Laterality Date   TUBAL LIGATION      Current Outpatient Medications  Medication Sig Dispense Refill   alendronate (FOSAMAX) 70 MG tablet Take 1 tablet (70 mg total) by mouth every 7 (seven) days. Take first thing in am with 6 oz. Water.  Be upright after taking.  Eat nothing for one hour. 12 tablet 3   levETIRAcetam (KEPPRA) 500 MG tablet Take 1 tablet (500 mg  total) by mouth 2 (two) times daily. 180 tablet 4   Multiple Vitamins-Minerals (MULTIVITAMIN PO) Take by mouth daily as needed (for supplement).      valACYclovir (VALTREX) 1000 MG tablet Take 1,000 mg by mouth 3 (three) times daily.     No current facility-administered medications for this visit.    Family History  Problem Relation Age of Onset   Stroke Mother    Diabetes Mother    Hypertension Mother    Colon cancer Father    Breast cancer Sister 85    Review of Systems  All other systems reviewed and are negative.   Exam:   BP 128/72   Pulse 92   Ht '5\' 4"'$  (1.626 m)   Wt 106 lb (48.1 kg)   LMP 02/25/1992   SpO2 95%   BMI 18.19 kg/m   Weight change: '@WEIGHTCHANGE'$ @ Height:   Height: '5\' 4"'$  (162.6 cm)  Ht Readings from Last 3 Encounters:  12/27/21 '5\' 4"'$  (1.626 m)  07/01/21 5' 5.5" (1.664 m)  02/13/21 '5\' 5"'$  (1.651 m)    General appearance: alert, cooperative and appears stated age Head: Normocephalic, without obvious abnormality, atraumatic Neck: no adenopathy, supple, symmetrical, trachea midline and thyroid normal to inspection and palpation Breasts: normal appearance, no masses or  tenderness Abdomen: soft, non-tender; non distended,  no masses,  no organomegaly Extremities: extremities normal, atraumatic, no cyanosis or edema Skin: Skin color, texture, turgor normal. No rashes or lesions Lymph nodes: Cervical, supraclavicular, and axillary nodes normal. No abnormal inguinal nodes palpated Neurologic: Grossly normal   Pelvic: External genitalia:  no lesions              Urethra:  normal appearing urethra with no masses, tenderness or lesions              Bartholins and Skenes: normal                 Vagina: atrophic appearing vagina with normal color and discharge, no lesions              Cervix: no lesions               Bimanual Exam:  Uterus:  normal size, contour, position, consistency, mobility, non-tender              Adnexa: no mass, fullness, tenderness                Rectovaginal: Confirms               Anus:  normal sphincter tone, no lesions  Gae Dry, CMA chaperoned for the exam.  1. Encounter for breast and pelvic examination Discussed breast self exam Discussed calcium and vit D intake Mammogram scheduled Colonoscopy UTD Labs with primary  2. History of osteoporosis - DG Bone Density; Future - alendronate (FOSAMAX) 70 MG tablet; Take 1 tablet (70 mg total) by mouth every 7 (seven) days. Take first thing in am with 6 oz. Water.  Be upright after taking.  Eat nothing for one hour.  Dispense: 12 tablet; Refill: 3  3. Hypoestrogenism - DG Bone Density; Future

## 2021-12-25 ENCOUNTER — Ambulatory Visit: Payer: BC Managed Care – PPO | Admitting: Obstetrics and Gynecology

## 2021-12-27 ENCOUNTER — Ambulatory Visit (INDEPENDENT_AMBULATORY_CARE_PROVIDER_SITE_OTHER): Payer: Medicare Other | Admitting: Obstetrics and Gynecology

## 2021-12-27 ENCOUNTER — Encounter: Payer: Self-pay | Admitting: Obstetrics and Gynecology

## 2021-12-27 VITALS — BP 128/72 | HR 92 | Ht 64.0 in | Wt 106.0 lb

## 2021-12-27 DIAGNOSIS — E2839 Other primary ovarian failure: Secondary | ICD-10-CM | POA: Diagnosis not present

## 2021-12-27 DIAGNOSIS — Z8739 Personal history of other diseases of the musculoskeletal system and connective tissue: Secondary | ICD-10-CM | POA: Diagnosis not present

## 2021-12-27 DIAGNOSIS — Z01419 Encounter for gynecological examination (general) (routine) without abnormal findings: Secondary | ICD-10-CM | POA: Diagnosis not present

## 2021-12-27 MED ORDER — ALENDRONATE SODIUM 70 MG PO TABS: 70.0000 mg | ORAL_TABLET | ORAL | 3 refills | Status: DC

## 2021-12-27 NOTE — Patient Instructions (Signed)

## 2022-01-01 ENCOUNTER — Encounter: Payer: Self-pay | Admitting: Family Medicine

## 2022-01-01 ENCOUNTER — Ambulatory Visit (INDEPENDENT_AMBULATORY_CARE_PROVIDER_SITE_OTHER): Payer: Medicare Other | Admitting: Family Medicine

## 2022-01-01 VITALS — BP 121/76 | HR 87 | Ht 65.0 in | Wt 107.1 lb

## 2022-01-01 DIAGNOSIS — Z8669 Personal history of other diseases of the nervous system and sense organs: Secondary | ICD-10-CM | POA: Diagnosis not present

## 2022-01-01 MED ORDER — LEVETIRACETAM 500 MG PO TABS: 500.0000 mg | ORAL_TABLET | Freq: Two times a day (BID) | ORAL | 4 refills | Status: DC

## 2022-01-01 NOTE — Progress Notes (Signed)
Established Patient Office Visit  Subjective:  Patient ID: Heidi Vasquez, female    DOB: January 20, 1941  Age: 81 y.o. MRN: [id number]  CC:  Chief Complaint  Patient presents with   Follow-up    6 month f/u. Needs refills on Keppra, will be out of medication soon.     HPI Heidi Vasquez is a 81 y.o. female with past medical history of seizure disorder presents for f/u of  chronic medical conditions.  Patient reports doing well and would like a refill of her Keppra medication.  She has not had any recent episodes of seizures.  No other complaints or concerns today.  Past Medical History:  Diagnosis Date   Allergic rhinitis    Convulsions (HCC)    Diverticulosis    Heart murmur    MVP (mitral valve prolapse)    Osteopenia    Intolerant of Fosamax    Past Surgical History:  Procedure Laterality Date   TUBAL LIGATION      Family History  Problem Relation Age of Onset   Stroke Mother    Diabetes Mother    Hypertension Mother    Colon cancer Father    Breast cancer Sister 64    Social History   Socioeconomic History   Marital status: Divorced    Spouse name: Not on file   Number of children: 2   Years of education: 16   Highest education level: Not on file  Occupational History   Occupation: Trial Coordinator    Comment: [employer]  Tobacco Use   Smoking status: Never   Smokeless tobacco: Never  Vaping Use   Vaping Use: Never used  Substance and Sexual Activity   Alcohol use: No   Drug use: No   Sexual activity: Not Currently    Partners: Male    Birth control/protection: Post-menopausal  Other Topics Concern   Not on file  Social History Narrative   Lives alone   Caffeine use-none   Social Determinants of Health   Financial Resource Strain: Low Risk  (10/29/2021)   Overall Financial Resource Strain (CARDIA)    Difficulty of Paying Living Expenses: Not hard at all  Food Insecurity: No Food Insecurity (10/29/2021)   Hunger Vital Sign     Worried About Running Out of Food in the Last Year: Never true    [place] in the Last Year: Never true  Transportation Needs: No Transportation Needs (10/29/2021)   PRAPARE - Hydrologist (Medical): No    Lack of Transportation (Non-Medical): No  Physical Activity: Sufficiently Active (10/29/2021)   Exercise Vital Sign    Days of Exercise per Week: 5 days    Minutes of Exercise per Session: 60 min  Stress: No Stress Concern Present (10/29/2021)   [place]    Feeling of Stress : Not at all  Social Connections: Moderately Integrated (10/29/2021)   Social Connection and Isolation Panel [NHANES]    Frequency of Communication with Friends and Family: Three times a week    Frequency of Social Gatherings with Friends and Family: Once a week    Attends Religious Services: 1 to 4 times per year    Active Member of [employer] or Organizations: Yes    Attends Archivist Meetings: 1 to 4 times per year    Marital Status: Divorced  Intimate Partner Violence: Not At Risk (10/29/2021)   Humiliation, Afraid, Rape, and  Kick questionnaire    Fear of Current or Ex-Partner: No    Emotionally Abused: No    Physically Abused: No    Sexually Abused: No    Outpatient Medications Prior to Visit  Medication Sig Dispense Refill   alendronate (FOSAMAX) 70 MG tablet Take 1 tablet (70 mg total) by mouth every 7 (seven) days. Take first thing in am with 6 oz. Water.  Be upright after taking.  Eat nothing for one hour. 12 tablet 3   Multiple Vitamins-Minerals (MULTIVITAMIN PO) Take by mouth daily as needed (for supplement).      levETIRAcetam (KEPPRA) 500 MG tablet Take 1 tablet (500 mg total) by mouth 2 (two) times daily. 180 tablet 4   valACYclovir (VALTREX) 1000 MG tablet Take 1,000 mg by mouth 3 (three) times daily.     No facility-administered medications prior to visit.    No Known Allergies  ROS Review  of Systems  Constitutional:  Negative for fatigue and fever.  Eyes:  Negative for visual disturbance.  Respiratory:  Negative for chest tightness and shortness of breath.   Cardiovascular:  Negative for chest pain and palpitations.  Neurological:  Negative for dizziness and headaches.      Objective:    Physical Exam HENT:     Head: Normocephalic.     Nose: No congestion.  Cardiovascular:     Rate and Rhythm: Normal rate and regular rhythm.     Pulses: Normal pulses.     Heart sounds: Normal heart sounds.  Pulmonary:     Effort: Pulmonary effort is normal.     Breath sounds: Normal breath sounds.  Neurological:     Mental Status: She is alert.     BP 121/76   Pulse 87   Ht 5' 5" (1.651 m)   Wt 107 lb 1.9 oz (48.6 kg)   LMP 02/25/1992   SpO2 95%   BMI 17.83 kg/m  Wt Readings from Last 3 Encounters:  01/01/22 107 lb 1.9 oz (48.6 kg)  12/27/21 106 lb (48.1 kg)  07/01/21 110 lb (49.9 kg)    Lab Results  Component Value Date   TSH 1.840 07/01/2021   Lab Results  Component Value Date   WBC 10.5 07/01/2021   HGB 14.0 07/01/2021   HCT 41.4 07/01/2021   MCV 92 07/01/2021   PLT 288 07/01/2021   Lab Results  Component Value Date   NA 140 07/01/2021   K 4.9 07/01/2021   CO2 27 07/01/2021   GLUCOSE 100 (H) 07/01/2021   BUN 13 07/01/2021   CREATININE 0.95 07/01/2021   BILITOT 0.6 07/01/2021   ALKPHOS 152 (H) 07/01/2021   AST 18 07/01/2021   ALT 13 07/01/2021   PROT 7.5 07/01/2021   ALBUMIN 4.3 07/01/2021   CALCIUM 9.6 07/01/2021   EGFR 61 07/01/2021   Lab Results  Component Value Date   CHOL 187 07/01/2021   Lab Results  Component Value Date   HDL 71 07/01/2021   Lab Results  Component Value Date   LDLCALC 95 07/01/2021   Lab Results  Component Value Date   TRIG 122 07/01/2021   Lab Results  Component Value Date   CHOLHDL 2.6 07/01/2021   Lab Results  Component Value Date   HGBA1C 6.0 (H) 07/01/2021      Assessment & Plan:   Problem  List Items Addressed This Visit       Other   Hx of seizure disorder - Primary    Continue treatment   regimen Continue taking Keppra 500 mg twice daily Will assess Keppra levels today      Relevant Medications   levETIRAcetam (KEPPRA) 500 MG tablet   Other Relevant Orders   Levetiracetam level    Meds ordered this encounter  Medications   levETIRAcetam (KEPPRA) 500 MG tablet    Sig: Take 1 tablet (500 mg total) by mouth 2 (two) times daily.    Dispense:  180 tablet    Refill:  4    Follow-up: Return in about 4 months (around 05/02/2022).    [name], FNP

## 2022-01-01 NOTE — Assessment & Plan Note (Signed)
Continue treatment regimen Continue taking Keppra 500 mg twice daily Will assess Keppra levels today

## 2022-01-01 NOTE — Patient Instructions (Signed)
I appreciate the opportunity to provide care to you today!    Follow up:  4 months  Labs: please stop by the lab today to get your blood drawn   Please pick up your medication at the pharmacy    Please continue to a heart-healthy diet and increase your physical activities. Try to exercise for 4mns at least three times a week.      It was a pleasure to see you and I look forward to continuing to work together on your health and well-being. Please do not hesitate to call the office if you need care or have questions about your care.   Have a wonderful day and week. With Gratitude, GAlvira MondayMSN, FNP-BC

## 2022-01-03 ENCOUNTER — Other Ambulatory Visit: Payer: Self-pay

## 2022-01-03 DIAGNOSIS — Z8669 Personal history of other diseases of the nervous system and sense organs: Secondary | ICD-10-CM

## 2022-01-15 LAB — LEVETIRACETAM LEVEL: Levetiracetam Lvl: 26.6 ug/mL (ref 10.0–40.0)

## 2022-01-22 ENCOUNTER — Ambulatory Visit: Payer: Medicare Other

## 2022-01-29 ENCOUNTER — Ambulatory Visit
Admission: RE | Admit: 2022-01-29 | Discharge: 2022-01-29 | Disposition: A | Discharge status: Home / Self Care | Payer: Medicare Other | Source: Ambulatory Visit | Attending: Obstetrics and Gynecology | Admitting: Obstetrics and Gynecology

## 2022-01-29 DIAGNOSIS — Z1231 Encounter for screening mammogram for malignant neoplasm of breast: Secondary | ICD-10-CM

## 2022-04-30 ENCOUNTER — Encounter: Payer: Self-pay | Admitting: Family Medicine

## 2022-04-30 ENCOUNTER — Ambulatory Visit: Payer: Medicare PPO | Admitting: Family Medicine

## 2022-04-30 VITALS — BP 132/79 | HR 91 | Ht 65.5 in | Wt 106.1 lb

## 2022-04-30 DIAGNOSIS — Z8669 Personal history of other diseases of the nervous system and sense organs: Secondary | ICD-10-CM

## 2022-04-30 DIAGNOSIS — R7301 Impaired fasting glucose: Secondary | ICD-10-CM | POA: Diagnosis not present

## 2022-04-30 DIAGNOSIS — E7849 Other hyperlipidemia: Secondary | ICD-10-CM

## 2022-04-30 DIAGNOSIS — E038 Other specified hypothyroidism: Secondary | ICD-10-CM

## 2022-04-30 DIAGNOSIS — E559 Vitamin D deficiency, unspecified: Secondary | ICD-10-CM | POA: Diagnosis not present

## 2022-04-30 NOTE — Progress Notes (Signed)
Established Patient Office Visit  Subjective:  Patient ID: Heidi Vasquez, female    DOB: 04-16-1940  Age: 82 y.o. MRN: [id number]  CC:  Chief Complaint  Patient presents with   Follow-up    4 month f/u     HPI Heidi Vasquez is a 82 y.o. female with past medical history of mitral valve prolapse, seizure disorder, syncope, and rash presents for f/u of  chronic medical conditions. For the details of today's visit, please refer to the assessment and plan.     Past Medical History:  Diagnosis Date   Allergic rhinitis    Convulsions (HCC)    Diverticulosis    Heart murmur    MVP (mitral valve prolapse)    Osteopenia    Intolerant of Fosamax    Past Surgical History:  Procedure Laterality Date   TUBAL LIGATION      Family History  Problem Relation Age of Onset   Stroke Mother    Diabetes Mother    Hypertension Mother    Colon cancer Father    Breast cancer Sister 85    Social History   Socioeconomic History   Marital status: Divorced    Spouse name: Not on file   Number of children: 2   Years of education: 16   Highest education level: Not on file  Occupational History   Occupation: Trial Coordinator    Comment: [employer]  Tobacco Use   Smoking status: Never   Smokeless tobacco: Never  Vaping Use   Vaping Use: Never used  Substance and Sexual Activity   Alcohol use: No   Drug use: No   Sexual activity: Not Currently    Partners: Male    Birth control/protection: Post-menopausal  Other Topics Concern   Not on file  Social History Narrative   Lives alone   Caffeine use-none   Social Determinants of Health   Financial Resource Strain: Low Risk  (10/29/2021)   Overall Financial Resource Strain (CARDIA)    Difficulty of Paying Living Expenses: Not hard at all  Food Insecurity: No Food Insecurity (10/29/2021)   Hunger Vital Sign    Worried About Running Out of Food in the Last Year: Never true    [place] in the Last Year: Never true   Transportation Needs: No Transportation Needs (10/29/2021)   PRAPARE - Hydrologist (Medical): No    Lack of Transportation (Non-Medical): No  Physical Activity: Sufficiently Active (10/29/2021)   Exercise Vital Sign    Days of Exercise per Week: 5 days    Minutes of Exercise per Session: 60 min  Stress: No Stress Concern Present (10/29/2021)   [place]    Feeling of Stress : Not at all  Social Connections: Moderately Integrated (10/29/2021)   Social Connection and Isolation Panel [NHANES]    Frequency of Communication with Friends and Family: Three times a week    Frequency of Social Gatherings with Friends and Family: Once a week    Attends Religious Services: 1 to 4 times per year    Active Member of [employer] or Organizations: Yes    Attends Archivist Meetings: 1 to 4 times per year    Marital Status: Divorced  Intimate Partner Violence: Not At Risk (10/29/2021)   Humiliation, Afraid, Rape, and Kick questionnaire    Fear of Current or Ex-Partner: No    Emotionally Abused: No  Physically Abused: No    Sexually Abused: No    Outpatient Medications Prior to Visit  Medication Sig Dispense Refill   alendronate (FOSAMAX) 70 MG tablet Take 1 tablet (70 mg total) by mouth every 7 (seven) days. Take first thing in am with 6 oz. Water.  Be upright after taking.  Eat nothing for one hour. 12 tablet 3   levETIRAcetam (KEPPRA) 500 MG tablet Take 1 tablet (500 mg total) by mouth 2 (two) times daily. 180 tablet 4   Multiple Vitamins-Minerals (MULTIVITAMIN PO) Take by mouth daily as needed (for supplement).      valACYclovir (VALTREX) 1000 MG tablet Take 1,000 mg by mouth 3 (three) times daily.     No facility-administered medications prior to visit.    No Known Allergies  ROS Review of Systems  Constitutional:  Negative for chills and fever.  Eyes:  Negative for visual disturbance.   Respiratory:  Negative for chest tightness and shortness of breath.   Neurological:  Negative for dizziness and headaches.      Objective:    Physical Exam HENT:     Head: Normocephalic.     Mouth/Throat:     Mouth: Mucous membranes are moist.  Cardiovascular:     Rate and Rhythm: Normal rate.     Heart sounds: Normal heart sounds.  Pulmonary:     Effort: Pulmonary effort is normal.     Breath sounds: Normal breath sounds.  Neurological:     Mental Status: She is alert.     BP 132/79   Pulse 91   Ht 5' 5.5" (1.664 m)   Wt 106 lb 1.9 oz (48.1 kg)   LMP 02/25/1992   SpO2 96%   BMI 17.39 kg/m  Wt Readings from Last 3 Encounters:  04/30/22 106 lb 1.9 oz (48.1 kg)  01/01/22 107 lb 1.9 oz (48.6 kg)  12/27/21 106 lb (48.1 kg)    Lab Results  Component Value Date   TSH 1.840 07/01/2021   Lab Results  Component Value Date   WBC 10.5 07/01/2021   HGB 14.0 07/01/2021   HCT 41.4 07/01/2021   MCV 92 07/01/2021   PLT 288 07/01/2021   Lab Results  Component Value Date   NA 140 07/01/2021   K 4.9 07/01/2021   CO2 27 07/01/2021   GLUCOSE 100 (H) 07/01/2021   BUN 13 07/01/2021   CREATININE 0.95 07/01/2021   BILITOT 0.6 07/01/2021   ALKPHOS 152 (H) 07/01/2021   AST 18 07/01/2021   ALT 13 07/01/2021   PROT 7.5 07/01/2021   ALBUMIN 4.3 07/01/2021   CALCIUM 9.6 07/01/2021   EGFR 61 07/01/2021   Lab Results  Component Value Date   CHOL 187 07/01/2021   Lab Results  Component Value Date   HDL 71 07/01/2021   Lab Results  Component Value Date   LDLCALC 95 07/01/2021   Lab Results  Component Value Date   TRIG 122 07/01/2021   Lab Results  Component Value Date   CHOLHDL 2.6 07/01/2021   Lab Results  Component Value Date   HGBA1C 6.0 (H) 07/01/2021      Assessment & Plan:  Hx of seizure disorder Assessment & Plan: No complaints voiced today Encouraged to continue taking Keppra 500 mg twice daily    IFG (impaired fasting glucose) -      Hemoglobin A1c  Vitamin D deficiency -     VITAMIN D 25 Hydroxy (Vit-D Deficiency, Fractures)  Other specified hypothyroidism -  TSH + free T4  Other hyperlipidemia -     Lipid panel -     CMP14+EGFR -     CBC with Differential/Platelet    Follow-up: Return in about 6 months (around 10/31/2022).   [name], FNP

## 2022-04-30 NOTE — Patient Instructions (Signed)
I appreciate the opportunity to provide care to you today!    Follow up:  6 months  Labs: please stop by the lab during the week to get your blood drawn (CBC, CMP, TSH, Lipid profile, HgA1c, Vit D)    Please continue to a heart-healthy diet and increase your physical activities. Try to exercise for 84mns at least five times a week.   Physical activity helps: Lower your blood glucose, improve your heart health, lower your blood pressure and cholesterol, burn calories to help manage her weight, gave you energy, lower stress, and improve his sleep.  The American diabetes Association (ADA) recommends being active for 2-1/2 hours (150 minutes) or more week.  Exercise for 30 minutes, 5 days a week (150 minutes total)    It was a pleasure to see you and I look forward to continuing to work together on your health and well-being. Please do not hesitate to call the office if you need care or have questions about your care.   Have a wonderful day and week. With Gratitude, GAlvira MondayMSN, FNP-BC

## 2022-04-30 NOTE — Assessment & Plan Note (Signed)
No complaints voiced today Encouraged to continue taking Keppra 500 mg twice daily

## 2022-05-02 ENCOUNTER — Ambulatory Visit: Payer: Medicare Other | Admitting: Family Medicine

## 2022-05-07 LAB — CBC WITH DIFFERENTIAL/PLATELET
Basophils Absolute: 0.1 10*3/uL (ref 0.0–0.2)
Basos: 1 %
EOS (ABSOLUTE): 0.1 10*3/uL (ref 0.0–0.4)
Eos: 1 %
Hematocrit: 39.4 % (ref 34.0–46.6)
Hemoglobin: 13 g/dL (ref 11.1–15.9)
Immature Grans (Abs): 0 10*3/uL (ref 0.0–0.1)
Immature Granulocytes: 0 %
Lymphocytes Absolute: 2.3 10*3/uL (ref 0.7–3.1)
Lymphs: 29 %
MCH: 30.8 pg (ref 26.6–33.0)
MCHC: 33 g/dL (ref 31.5–35.7)
MCV: 93 fL (ref 79–97)
Monocytes Absolute: 0.5 10*3/uL (ref 0.1–0.9)
Monocytes: 6 %
Neutrophils Absolute: 5.1 10*3/uL (ref 1.4–7.0)
Neutrophils: 63 %
Platelets: 280 10*3/uL (ref 150–450)
RBC: 4.22 x10E6/uL (ref 3.77–5.28)
RDW: 11.6 % — ABNORMAL LOW (ref 11.7–15.4)
WBC: 8 10*3/uL (ref 3.4–10.8)

## 2022-05-07 LAB — CMP14+EGFR
ALT: 13 IU/L (ref 0–32)
AST: 20 IU/L (ref 0–40)
Albumin/Globulin Ratio: 1.6 (ref 1.2–2.2)
Albumin: 4.2 g/dL (ref 3.7–4.7)
Alkaline Phosphatase: 103 IU/L (ref 44–121)
BUN/Creatinine Ratio: 23 (ref 12–28)
BUN: 22 mg/dL (ref 8–27)
Bilirubin Total: 0.8 mg/dL (ref 0.0–1.2)
CO2: 25 mmol/L (ref 20–29)
Calcium: 9.3 mg/dL (ref 8.7–10.3)
Chloride: 101 mmol/L (ref 96–106)
Creatinine, Ser: 0.94 mg/dL (ref 0.57–1.00)
Globulin, Total: 2.7 g/dL (ref 1.5–4.5)
Glucose: 94 mg/dL (ref 70–99)
Potassium: 4.1 mmol/L (ref 3.5–5.2)
Sodium: 140 mmol/L (ref 134–144)
Total Protein: 6.9 g/dL (ref 6.0–8.5)
eGFR: 61 mL/min/{1.73_m2} (ref >59)

## 2022-05-07 LAB — TSH+FREE T4
Free T4: 1.44 ng/dL (ref 0.82–1.77)
TSH: 3.08 u[IU]/mL (ref 0.450–4.500)

## 2022-05-07 LAB — HEMOGLOBIN A1C
Est. average glucose Bld gHb Est-mCnc: 128 mg/dL
Hgb A1c MFr Bld: 6.1 % — ABNORMAL HIGH (ref 4.8–5.6)

## 2022-05-07 LAB — LIPID PANEL
Chol/HDL Ratio: 2.3 ratio (ref 0.0–4.4)
Cholesterol, Total: 165 mg/dL (ref 100–199)
HDL: 71 mg/dL (ref >39)
LDL Chol Calc (NIH): 80 mg/dL (ref 0–99)
Triglycerides: 74 mg/dL (ref 0–149)
VLDL Cholesterol Cal: 14 mg/dL (ref 5–40)

## 2022-05-07 LAB — VITAMIN D 25 HYDROXY (VIT D DEFICIENCY, FRACTURES): Vit D, 25-Hydroxy: 45.1 ng/mL (ref 30.0–100.0)

## 2022-05-07 NOTE — Progress Notes (Signed)
Please inform the patient that her hemoglobin A1c indicates that she is prediabetic.  I recommend avoiding simple carbohydrates, including cakes, sweet desserts, ice cream, soda (diet or regular), sweet tea, candies, chips, cookies, store-bought juices, alcohol in excess of 1-2 drinks a day, lemonade, artificial sweeteners, donuts, coffee creamers, and sugar-free products.  I recommend avoiding greasy, fatty foods with increased physical activity.  All other labs are stable

## 2022-08-25 ENCOUNTER — Telehealth: Payer: Self-pay | Admitting: Family Medicine

## 2022-08-25 NOTE — Telephone Encounter (Signed)
Patient called for date of service 11.08.2023 medicare denied , she says she has [employer] went to verify [facility] and not eligible til 01.01.2024. Says she has humana replacement plan unable to get it verified. Traditional Medicare denied her date of service 11.08.2023. Can you look into this and give patient a call back.

## 2022-11-03 ENCOUNTER — Ambulatory Visit: Payer: Medicare PPO | Admitting: Family Medicine

## 2022-11-03 ENCOUNTER — Encounter: Payer: Self-pay | Admitting: Family Medicine

## 2022-11-03 VITALS — BP 113/69 | HR 96 | Ht 65.0 in | Wt 104.0 lb

## 2022-11-03 DIAGNOSIS — E7849 Other hyperlipidemia: Secondary | ICD-10-CM | POA: Diagnosis not present

## 2022-11-03 DIAGNOSIS — E559 Vitamin D deficiency, unspecified: Secondary | ICD-10-CM

## 2022-11-03 DIAGNOSIS — R7301 Impaired fasting glucose: Secondary | ICD-10-CM

## 2022-11-03 DIAGNOSIS — E038 Other specified hypothyroidism: Secondary | ICD-10-CM

## 2022-11-03 DIAGNOSIS — R7303 Prediabetes: Secondary | ICD-10-CM | POA: Insufficient documentation

## 2022-11-03 NOTE — Assessment & Plan Note (Signed)
 For managing prediabetes, I recommend the following lifestyle changes:  Reduce Intake of High-Sugar Foods and Beverages: Limit foods and drinks high in sugar to help regulate blood sugar levels. Increase Consumption of Nutrient-Rich Foods: Focus on incorporating more fruits, vegetables, and whole grains into your diet. Choose Lean Proteins: Opt for lean proteins such as chicken, fish, beans, and legumes. Select Low-Fat Dairy Products: Choose low-fat or non-fat dairy options. Minimize Saturated Fats, Trans Fats, and Cholesterol: Reduce intake of foods high in saturated fats, trans fatty acids, and cholesterol. Engage in Regular Physical Activity: Aim for at least 30 minutes of brisk walking or other moderate activity at least 5 days a week.

## 2022-11-03 NOTE — Progress Notes (Signed)
Established Patient Office Visit  Subjective:  Patient ID: Heidi Vasquez, female    DOB: Sep 29, 1940  Age: 82 y.o. MRN: [id number]  CC:  Chief Complaint  Patient presents with   Care Management    6 month f/u    HPI Heidi Vasquez is a 82 y.o. female with past medical history of prediabetes presents for f/u of  chronic medical conditions. For the details of today's visit, please refer to the assessment and plan.     Past Medical History:  Diagnosis Date   Allergic rhinitis    Convulsions (HCC)    Diverticulosis    Heart murmur    MVP (mitral valve prolapse)    Osteopenia    Intolerant of Fosamax    Past Surgical History:  Procedure Laterality Date   TUBAL LIGATION      Family History  Problem Relation Age of Onset   Stroke Mother    Diabetes Mother    Hypertension Mother    Colon cancer Father    Breast cancer Sister 93    Social History   Socioeconomic History   Marital status: Divorced    Spouse name: Not on file   Number of children: 2   Years of education: 16   Highest education level: Not on file  Occupational History   Occupation: Trial Coordinator    Comment: Scientific laboratory technician  Tobacco Use   Smoking status: Never   Smokeless tobacco: Never  Vaping Use   Vaping status: Never Used  Substance and Sexual Activity   Alcohol use: No   Drug use: No   Sexual activity: Not Currently    Partners: Male    Birth control/protection: Post-menopausal  Other Topics Concern   Not on file  Social History Narrative   Lives alone   Caffeine use-none   Social Determinants of Health   Financial Resource Strain: Low Risk  (10/29/2021)   Overall Financial Resource Strain (CARDIA)    Difficulty of Paying Living Expenses: Not hard at all  Food Insecurity: No Food Insecurity (10/29/2021)   Hunger Vital Sign    Worried About Running Out of Food in the Last Year: Never true    Ran Out of Food in the Last Year: Never true  Transportation Needs: No  Transportation Needs (10/29/2021)   PRAPARE - Administrator, Civil Service (Medical): No    Lack of Transportation (Non-Medical): No  Physical Activity: Sufficiently Active (10/29/2021)   Exercise Vital Sign    Days of Exercise per Week: 5 days    Minutes of Exercise per Session: 60 min  Stress: No Stress Concern Present (10/29/2021)   [employer] of Occupational Health - Occupational Stress Questionnaire    Feeling of Stress : Not at all  Social Connections: Moderately Integrated (10/29/2021)   Social Connection and Isolation Panel [NHANES]    Frequency of Communication with Friends and Family: Three times a week    Frequency of Social Gatherings with Friends and Family: Once a week    Attends Religious Services: 1 to 4 times per year    Active Member of [employer] or Organizations: Yes    Attends Banker Meetings: 1 to 4 times per year    Marital Status: Divorced  Intimate Partner Violence: Not At Risk (10/29/2021)   Humiliation, Afraid, Rape, and Kick questionnaire    Fear of Current or Ex-Partner: No    Emotionally Abused: No    Physically Abused: No  Sexually Abused: No    Outpatient Medications Prior to Visit  Medication Sig Dispense Refill   alendronate (FOSAMAX) 70 MG tablet Take 1 tablet (70 mg total) by mouth every 7 (seven) days. Take first thing in am with 6 oz. Water.  Be upright after taking.  Eat nothing for one hour. 12 tablet 3   levETIRAcetam (KEPPRA) 500 MG tablet Take 1 tablet (500 mg total) by mouth 2 (two) times daily. 180 tablet 4   Multiple Vitamins-Minerals (MULTIVITAMIN PO) Take by mouth daily as needed (for supplement).      No facility-administered medications prior to visit.    No Known Allergies  ROS Review of Systems  Constitutional:  Negative for chills and fever.  Eyes:  Negative for visual disturbance.  Respiratory:  Negative for chest tightness and shortness of breath.   Neurological:  Negative for dizziness and  headaches.      Objective:    Physical Exam HENT:     Head: Normocephalic.     Mouth/Throat:     Mouth: Mucous membranes are moist.  Cardiovascular:     Rate and Rhythm: Normal rate.     Heart sounds: Normal heart sounds.  Pulmonary:     Effort: Pulmonary effort is normal.     Breath sounds: Normal breath sounds.  Neurological:     Mental Status: She is alert.     BP 113/69   Pulse 96   Ht 5\' 5"  (1.651 m)   Wt 104 lb (47.2 kg)   LMP 02/25/1992   SpO2 96%   BMI 17.31 kg/m  Wt Readings from Last 3 Encounters:  11/03/22 104 lb (47.2 kg)  04/30/22 106 lb 1.9 oz (48.1 kg)  01/01/22 107 lb 1.9 oz (48.6 kg)    Lab Results  Component Value Date   TSH 3.080 05/06/2022   Lab Results  Component Value Date   WBC 8.0 05/06/2022   HGB 13.0 05/06/2022   HCT 39.4 05/06/2022   MCV 93 05/06/2022   PLT 280 05/06/2022   Lab Results  Component Value Date   NA 140 05/06/2022   K 4.1 05/06/2022   CO2 25 05/06/2022   GLUCOSE 94 05/06/2022   BUN 22 05/06/2022   CREATININE 0.94 05/06/2022   BILITOT 0.8 05/06/2022   ALKPHOS 103 05/06/2022   AST 20 05/06/2022   ALT 13 05/06/2022   PROT 6.9 05/06/2022   ALBUMIN 4.2 05/06/2022   CALCIUM 9.3 05/06/2022   EGFR 61 05/06/2022   Lab Results  Component Value Date   CHOL 165 05/06/2022   Lab Results  Component Value Date   HDL 71 05/06/2022   Lab Results  Component Value Date   LDLCALC 80 05/06/2022   Lab Results  Component Value Date   TRIG 74 05/06/2022   Lab Results  Component Value Date   CHOLHDL 2.3 05/06/2022   Lab Results  Component Value Date   HGBA1C 6.1 (H) 05/06/2022      Assessment & Plan:  Prediabetes Assessment & Plan: For managing prediabetes, I recommend the following lifestyle changes:  Reduce Intake of High-Sugar Foods and Beverages: Limit foods and drinks high in sugar to help regulate blood sugar levels. Increase Consumption of Nutrient-Rich Foods: Focus on incorporating more fruits,  vegetables, and whole grains into your diet. Choose Lean Proteins: Opt for lean proteins such as chicken, fish, beans, and legumes. Select Low-Fat Dairy Products: Choose low-fat or non-fat dairy options. Minimize Saturated Fats, Trans Fats, and Cholesterol: Reduce intake of  foods high in saturated fats, trans fatty acids, and cholesterol. Engage in Regular Physical Activity: Aim for at least 30 minutes of brisk walking or other moderate activity at least 5 days a week.    IFG (impaired fasting glucose) -     Hemoglobin A1c  Vitamin D deficiency -     VITAMIN D 25 Hydroxy (Vit-D Deficiency, Fractures)  Other specified hypothyroidism -     TSH + free T4  Other hyperlipidemia -     Lipid panel -     CMP14+EGFR -     CBC with Differential/Platelet  Note: This chart has been completed using Engineer, civil (consulting) software, and while attempts have been made to ensure accuracy, certain words and phrases may not be transcribed as intended.    Follow-up: Return in about 6 months (around 05/03/2023).   Gilmore Laroche, FNP

## 2022-11-03 NOTE — Patient Instructions (Signed)
I appreciate the opportunity to provide care to you today!    Follow up:  6 months  Fasting Labs: please stop by the lab today to get your blood drawn (CBC, CMP, TSH, Lipid profile, HgA1c, Vit D)  For managing prediabetes, I recommend the following lifestyle changes:  Reduce Intake of High-Sugar Foods and Beverages: Limit foods and drinks high in sugar to help regulate blood sugar levels. Increase Consumption of Nutrient-Rich Foods: Focus on incorporating more fruits, vegetables, and whole grains into your diet. Choose Lean Proteins: Opt for lean proteins such as chicken, fish, beans, and legumes. Select Low-Fat Dairy Products: Choose low-fat or non-fat dairy options. Minimize Saturated Fats, Trans Fats, and Cholesterol: Reduce intake of foods high in saturated fats, trans fatty acids, and cholesterol. Engage in Regular Physical Activity: Aim for at least 30 minutes of brisk walking or other moderate activity at least 5 days a week.   Attached with your AVS, you will find valuable resources for self-education. I highly recommend dedicating some time to thoroughly examine them.   Please continue to a heart-healthy diet and increase your physical activities. Try to exercise for at least five days a week.    It was a pleasure to see you and I look forward to continuing to work together on your health and well-being. Please do not hesitate to call the office if you need care or have questions about your care.  In case of emergency, please visit the Emergency Department for urgent care, or contact our clinic at [phone] to schedule an appointment. We're here to help you!   Have a wonderful day and week. With Gratitude, Gilmore Laroche MSN, FNP-BC

## 2022-11-17 ENCOUNTER — Ambulatory Visit
Admission: RE | Admit: 2022-11-17 | Discharge: 2022-11-17 | Disposition: A | Discharge status: Home / Self Care | Payer: Medicare PPO | Source: Ambulatory Visit | Attending: Obstetrics and Gynecology | Admitting: Obstetrics and Gynecology

## 2022-11-17 DIAGNOSIS — E2839 Other primary ovarian failure: Secondary | ICD-10-CM

## 2022-11-17 DIAGNOSIS — N958 Other specified menopausal and perimenopausal disorders: Secondary | ICD-10-CM | POA: Diagnosis not present

## 2022-11-17 DIAGNOSIS — E349 Endocrine disorder, unspecified: Secondary | ICD-10-CM | POA: Diagnosis not present

## 2022-11-17 DIAGNOSIS — M8588 Other specified disorders of bone density and structure, other site: Secondary | ICD-10-CM | POA: Diagnosis not present

## 2022-11-17 DIAGNOSIS — Z8739 Personal history of other diseases of the musculoskeletal system and connective tissue: Secondary | ICD-10-CM

## 2022-11-19 ENCOUNTER — Ambulatory Visit (INDEPENDENT_AMBULATORY_CARE_PROVIDER_SITE_OTHER): Payer: Medicare PPO

## 2022-11-19 VITALS — Ht 65.5 in | Wt 104.0 lb

## 2022-11-19 DIAGNOSIS — Z0000 Encounter for general adult medical examination without abnormal findings: Secondary | ICD-10-CM | POA: Diagnosis not present

## 2022-11-19 NOTE — Progress Notes (Signed)
 Because this visit was a virtual/telehealth visit,  certain criteria was not obtained, such a blood pressure, CBG if applicable, and timed get up and go. Any medications not marked as "taking" were not mentioned during the medication reconciliation part of the visit. Any vitals not documented were not able to be obtained due to this being a telehealth visit or patient was unable to self-report a recent blood pressure reading due to a lack of equipment at home via telehealth. Vitals that have been documented are verbally provided by the patient.   Subjective:   Heidi Vasquez is a 81 y.o. female who presents for Medicare Annual (Subsequent) preventive examination.  Visit Complete: Virtual  I connected with  Heidi Vasquez on 11/19/22 by a audio enabled telemedicine application and verified that I am speaking with the correct person using two identifiers.  Patient Location: Home  Provider Location: Home Office  I discussed the limitations of evaluation and management by telemedicine. The patient expressed understanding and agreed to proceed.  Patient Medicare AWV questionnaire was completed by the patient on na; I have confirmed that all information answered by patient is correct and no changes since this date.  Cardiac Risk Factors include: advanced age (>77men, >67 women)     Objective:    Today's Vitals   11/19/22 0912 11/19/22 0913  Weight: 104 lb (47.2 kg)   Height: 5' 5.5" (1.664 m)   PainSc:  0-No pain   Body mass index is 17.04 kg/m.     11/19/2022    9:12 AM 10/29/2021   10:00 AM 12/19/2015   11:27 AM 06/15/2015   11:34 AM 04/20/2015   10:37 AM  Advanced Directives  Does Patient Have a Medical Advance Directive? No Yes Yes Yes Yes  Type of Advance Directive  Living will Living will;Healthcare Power of [employer] Power of [place];Living will Healthcare Power of [place];Living will  Copy of Healthcare Power of Attorney in Chart?  No - copy requested No - copy  requested  No - copy requested  Would patient like information on creating a medical advance directive? No - Patient declined        Current Medications (verified) Outpatient Encounter Medications as of 11/19/2022  Medication Sig   alendronate (FOSAMAX) 70 MG tablet Take 1 tablet (70 mg total) by mouth every 7 (seven) days. Take first thing in am with 6 oz. Water.  Be upright after taking.  Eat nothing for one hour.   levETIRAcetam (KEPPRA) 500 MG tablet Take 1 tablet (500 mg total) by mouth 2 (two) times daily.   Multiple Vitamins-Minerals (MULTIVITAMIN PO) Take by mouth daily as needed (for supplement).    No facility-administered encounter medications on file as of 11/19/2022.    Allergies (verified) Patient has no known allergies.   History: Past Medical History:  Diagnosis Date   Allergic rhinitis    Convulsions (HCC)    Diverticulosis    Heart murmur    MVP (mitral valve prolapse)    Osteopenia    Intolerant of Fosamax   Past Surgical History:  Procedure Laterality Date   TUBAL LIGATION     Family History  Problem Relation Age of Onset   Stroke Mother    Diabetes Mother    Hypertension Mother    Colon cancer Father    Breast cancer Sister 26   Social History   Socioeconomic History   Marital status: Divorced    Spouse name: Not on file   Number of children: 2  Years of education: 22   Highest education level: Not on file  Occupational History   Occupation: Trial Coordinator    Comment: [employer]  Tobacco Use   Smoking status: Never   Smokeless tobacco: Never  Vaping Use   Vaping status: Never Used  Substance and Sexual Activity   Alcohol use: No   Drug use: No   Sexual activity: Not Currently    Partners: Male    Birth control/protection: Post-menopausal  Other Topics Concern   Not on file  Social History Narrative   Lives alone   Caffeine use-none   Social Determinants of Health   Financial Resource Strain: Low Risk  (11/19/2022)    Overall Financial Resource Strain (CARDIA)    Difficulty of Paying Living Expenses: Not hard at all  Food Insecurity: No Food Insecurity (11/19/2022)   Hunger Vital Sign    Worried About Running Out of Food in the Last Year: Never true    Ran Out of Food in the Last Year: Never true  Transportation Needs: No Transportation Needs (11/19/2022)   PRAPARE - Administrator, Civil Service (Medical): No    Lack of Transportation (Non-Medical): No  Physical Activity: Sufficiently Active (11/19/2022)   Exercise Vital Sign    Days of Exercise per Week: 7 days    Minutes of Exercise per Session: 30 min  Stress: No Stress Concern Present (11/19/2022)   [employer] of Occupational Health - Occupational Stress Questionnaire    Feeling of Stress : Not at all  Social Connections: Moderately Integrated (11/19/2022)   Social Connection and Isolation Panel [NHANES]    Frequency of Communication with Friends and Family: More than three times a week    Frequency of Social Gatherings with Friends and Family: More than three times a week    Attends Religious Services: More than 4 times per year    Active Member of [employer] or Organizations: Yes    Attends Engineer, structural: More than 4 times per year    Marital Status: Divorced    Tobacco Counseling Counseling given: Yes   Clinical Intake:  Pre-visit preparation completed: Yes  Pain : No/denies pain Pain Score: 0-No pain     BMI - recorded: 17.04 Nutritional Status: BMI <19  Underweight Nutritional Risks: None Diabetes: No  How often do you need to have someone help you when you read instructions, pamphlets, or other written materials from your doctor or pharmacy?: 1 - Never  Interpreter Needed?: No  Information entered by ::  Mariaeduarda Defranco, CMA   Activities of Daily Living    11/19/2022    9:23 AM  In your present state of health, do you have any difficulty performing the following activities:  Hearing? 0   Vision? 0  Difficulty concentrating or making decisions? 0  Walking or climbing stairs? 0  Dressing or bathing? 0  Doing errands, shopping? 0  Preparing Food and eating ? N  Using the Toilet? N  In the past six months, have you accidently leaked urine? N  Do you have problems with loss of bowel control? N  Managing your Medications? N  Managing your Finances? N  Housekeeping or managing your Housekeeping? N    Patient Care Team: Gilmore Laroche, FNP as PCP - General (Family Medicine) Romualdo Bolk, MD (Inactive) as Consulting Physician (Obstetrics and Gynecology)  Indicate any recent Medical Services you may have received from other than [facility] providers in the past year (date may be approximate).  Assessment:   This is a routine wellness examination for [place].  Hearing/Vision screen Hearing Screening - Comments:: Patient denies any hearing difficulties.   Vision Screening - Comments:: Patient wears reading glasses only and is utd with yearly eye exams at [facility]    Goals Addressed             This Visit's Progress    Patient Stated   On track    Stay healthy and keep being independent        Depression Screen    11/19/2022    9:18 AM 11/03/2022    1:01 PM 04/30/2022    3:20 PM 01/01/2022   11:14 AM 01/01/2022   11:13 AM 10/29/2021   10:01 AM 10/29/2021    9:59 AM  PHQ 2/9 Scores  PHQ - 2 Score 0 0 0 0 0 0 0  PHQ- 9 Score 0 0 0 0 0    Exception Documentation  Patient refusal         Fall Risk    11/19/2022    9:23 AM 11/03/2022    1:01 PM 04/30/2022    3:20 PM 01/01/2022   11:13 AM 10/29/2021   10:01 AM  Fall Risk   Falls in the past year? 0 0 0 0 0  Number falls in past yr: 0 0 0 0 0  Injury with Fall? 0 0 0 0 0  Risk for fall due to : No Fall Risks No Fall Risks  No Fall Risks No Fall Risks  Follow up Falls prevention discussed Falls evaluation completed Falls evaluation completed Falls evaluation completed Falls evaluation completed     MEDICARE RISK AT HOME: Medicare Risk at Home Any stairs in or around the home?: Yes If so, are there any without handrails?: No Home free of loose throw rugs in walkways, pet beds, electrical cords, etc?: Yes Adequate lighting in your home to reduce risk of falls?: Yes Life alert?: No Use of a cane, walker or w/c?: No Grab bars in the bathroom?: No Shower chair or bench in shower?: No Elevated toilet seat or a handicapped toilet?: No  TIMED UP AND GO:  Was the test performed?  No    Cognitive Function:        11/19/2022    9:15 AM 10/29/2021   10:02 AM  6CIT Screen  What Year? 0 points 0 points  What month? 0 points 0 points  What time? 0 points 0 points  Count back from 20 0 points 0 points  Months in reverse 0 points 0 points  Repeat phrase 0 points 0 points  Total Score 0 points 0 points    Immunizations Immunization History  Administered Date(s) Administered   Fluad Quad(high Dose 65+) 11/22/2021   Influenza Whole 12/29/2008, 11/24/2009   Influenza, High Dose Seasonal PF 11/15/2018   Pneumococcal Polysaccharide-23 02/25/2007   Tdap 10/21/2013    TDAP status: Up to date  Flu Vaccine status: Due, Education has been provided regarding the importance of this vaccine. Advised may receive this vaccine at local pharmacy or Health Dept. Aware to provide a copy of the vaccination record if obtained from local pharmacy or Health Dept. Verbalized acceptance and understanding.  Pneumococcal vaccine status: Due, Education has been provided regarding the importance of this vaccine. Advised may receive this vaccine at local pharmacy or Health Dept. Aware to provide a copy of the vaccination record if obtained from local pharmacy or Health Dept. Verbalized acceptance and understanding.  Covid-19 vaccine  status: Information provided on how to obtain vaccines.   Qualifies for Shingles Vaccine? Yes   Zostavax completed No   Discontinue by provider  Screening Tests Health  Maintenance  Topic Date Due   COVID-19 Vaccine (1 - 2023-24 season) Never done   Medicare Annual Wellness (AWV)  10/30/2022   Pneumonia Vaccine 13+ Years old (2 of 2 - PCV) 04/30/2023 (Originally 02/25/2008)   INFLUENZA VACCINE  05/25/2023 (Originally 09/25/2022)   DTaP/Tdap/Td (2 - Td or Tdap) 10/22/2023   DEXA SCAN  11/16/2024   HPV VACCINES  Aged Out   Zoster Vaccines- Shingrix  Discontinued    Health Maintenance  Health Maintenance Due  Topic Date Due   COVID-19 Vaccine (1 - 2023-24 season) Never done   Medicare Annual Wellness (AWV)  10/30/2022    Colorectal cancer screening: No longer required.   Mammogram status: Completed 01/29/2022. Repeat every year  Bone Density status: Completed 11/17/2022. Results reflect: Bone density results: OSTEOPOROSIS. Repeat every 2 years.  Lung Cancer Screening: (Low Dose CT Chest recommended if Age 38-80 years, 20 pack-year currently smoking OR have quit w/in 15years.) does not qualify.    Additional Screening:  Hepatitis C Screening: does not qualify;   Vision Screening: Recommended annual ophthalmology exams for early detection of glaucoma and other disorders of the eye. Is the patient up to date with their annual eye exam?  Yes  Who is the provider or what is the name of the office in which the patient attends annual eye exams? [facility]  .   Dental Screening: Recommended annual dental exams for proper oral hygiene  Diabetic Foot Exam: n/a  Community Resource Referral / Chronic Care Management: CRR required this visit?  No   CCM required this visit?  No     Plan:     I have personally reviewed and noted the following in the patient's chart:   Medical and social history Use of alcohol, tobacco or illicit drugs  Current medications and supplements including opioid prescriptions. Patient is not currently taking opioid prescriptions. Functional ability and status Nutritional status Physical activity Advanced  directives List of other physicians Hospitalizations, surgeries, and ER visits in previous 12 months Vitals Screenings to include cognitive, depression, and falls Referrals and appointments  In addition, I have reviewed and discussed with patient certain preventive protocols, quality metrics, and best practice recommendations. A written personalized care plan for preventive services as well as general preventive health recommendations were provided to patient.     Jordan Hawks Tayonna Bacha, CMA   11/19/2022   After Visit Summary: (MyChart) Due to this being a telephonic visit, the after visit summary with patients personalized plan was offered to patient via MyChart

## 2022-11-19 NOTE — Patient Instructions (Signed)
[name] , Thank you for taking time to come for your Medicare Wellness Visit. I appreciate your ongoing commitment to your health goals. Please review the following plan we discussed and let me know if I can assist you in the future.   Referrals/Orders/Follow-Ups/Clinician Recommendations:  Next Medicare Annual Wellness Visit: February 17, 2024 at 9:20am  This is a list of the screening recommended for you and due dates:  Health Maintenance  Topic Date Due   COVID-19 Vaccine (1 - 2023-24 season) Never done   Pneumonia Vaccine (2 of 2 - PCV) 04/30/2023*   Flu Shot  05/25/2023*   DTaP/Tdap/Td vaccine (2 - Td or Tdap) 10/22/2023   Medicare Annual Wellness Visit  11/19/2023   DEXA scan (bone density measurement)  11/16/2024   HPV Vaccine  Aged Out   Zoster (Shingles) Vaccine  Discontinued  *Topic was postponed. The date shown is not the original due date.    Advanced directives: (Copy Requested) Please bring a copy of your health care power of attorney and living will to the office to be added to your chart at your convenience.  Next Medicare Annual Wellness Visit scheduled for next year: Yes  Preventive Care 82 Years and Older, Female Preventive care refers to lifestyle choices and visits with your health care provider that can promote health and wellness. Preventive care visits are also called wellness exams. What can I expect for my preventive care visit? Counseling Your health care provider may ask you questions about your: Medical history, including: Past medical problems. Family medical history. Pregnancy and menstrual history. History of falls. Current health, including: Memory and ability to understand (cognition). Emotional well-being. Home life and relationship well-being. Sexual activity and sexual health. Lifestyle, including: Alcohol, nicotine or tobacco, and drug use. Access to firearms. Diet, exercise, and sleep habits. Work and work Astronomer. Sunscreen  use. Safety issues such as seatbelt and bike helmet use. Physical exam Your health care provider will check your: Height and weight. These may be used to calculate your BMI (body mass index). BMI is a measurement that tells if you are at a healthy weight. Waist circumference. This measures the distance around your waistline. This measurement also tells if you are at a healthy weight and may help predict your risk of certain diseases, such as type 2 diabetes and high blood pressure. Heart rate and blood pressure. Body temperature. Skin for abnormal spots. What immunizations do I need?  Vaccines are usually given at various ages, according to a schedule. Your health care provider will recommend vaccines for you based on your age, medical history, and lifestyle or other factors, such as travel or where you work. What tests do I need? Screening Your health care provider may recommend screening tests for certain conditions. This may include: Lipid and cholesterol levels. Hepatitis C test. Hepatitis B test. HIV (human immunodeficiency virus) test. STI (sexually transmitted infection) testing, if you are at risk. Lung cancer screening. Colorectal cancer screening. Diabetes screening. This is done by checking your blood sugar (glucose) after you have not eaten for a while (fasting). Mammogram. Talk with your health care provider about how often you should have regular mammograms. BRCA-related cancer screening. This may be done if you have a family history of breast, ovarian, tubal, or peritoneal cancers. Bone density scan. This is done to screen for osteoporosis. Talk with your health care provider about your test results, treatment options, and if necessary, the need for more tests. Follow these instructions at home: Eating and  drinking  Eat a diet that includes fresh fruits and vegetables, whole grains, lean protein, and low-fat dairy products. Limit your intake of foods with high amounts of  sugar, saturated fats, and salt. Take vitamin and mineral supplements as recommended by your health care provider. Do not drink alcohol if your health care provider tells you not to drink. If you drink alcohol: Limit how much you have to 0-1 drink a day. Know how much alcohol is in your drink. In the [place] one drink equals one 12 oz bottle of beer (355 mL), one 5 oz glass of wine (148 mL), or one 1 oz glass of hard liquor (44 mL). Lifestyle Brush your teeth every morning and night with fluoride toothpaste. Floss one time each day. Exercise for at least 30 minutes 5 or more days each week. Do not use any products that contain nicotine or tobacco. These products include cigarettes, chewing tobacco, and vaping devices, such as e-cigarettes. If you need help quitting, ask your health care provider. Do not use drugs. If you are sexually active, practice safe sex. Use a condom or other form of protection in order to prevent STIs. Take aspirin only as told by your health care provider. Make sure that you understand how much to take and what form to take. Work with your health care provider to find out whether it is safe and beneficial for you to take aspirin daily. Ask your health care provider if you need to take a cholesterol-lowering medicine (statin). Find healthy ways to manage stress, such as: Meditation, yoga, or listening to music. Journaling. Talking to a trusted person. Spending time with friends and family. Minimize exposure to UV radiation to reduce your risk of skin cancer. Safety Always wear your seat belt while driving or riding in a vehicle. Do not drive: If you have been drinking alcohol. Do not ride with someone who has been drinking. When you are tired or distracted. While texting. If you have been using any mind-altering substances or drugs. Wear a helmet and other protective equipment during sports activities. If you have firearms in your house, make sure you follow all gun  safety procedures. What's next? Visit your health care provider once a year for an annual wellness visit. Ask your health care provider how often you should have your eyes and teeth checked. Stay up to date on all vaccines. This information is not intended to replace advice given to you by your health care provider. Make sure you discuss any questions you have with your health care provider. Document Revised: 08/08/2020 Document Reviewed: 08/08/2020 Elsevier Patient Education  2024 [employer]. Understanding Your Risk for Falls Millions of people have serious injuries from falls each year. It is important to understand your risk of falling. Talk with your health care provider about your risk and what you can do to lower it. If you do have a serious fall, make sure to tell your provider. Falling once raises your risk of falling again. How can falls affect me? Serious injuries from falls are common. These include: Broken bones, such as hip fractures. Head injuries, such as traumatic brain injuries (TBI) or concussions. A fear of falling can cause you to avoid activities and stay at home. This can make your muscles weaker and raise your risk for a fall. What can increase my risk? There are a number of risk factors that increase your risk for falling. The more risk factors you have, the higher your risk of falling. Serious injuries  from a fall happen most often to people who are older than 82 years old. Teenagers and young adults ages 80-29 are also at higher risk. Common risk factors include: Weakness in the lower body. Being generally weak or confused due to long-term (chronic) illness. Dizziness or balance problems. Poor vision. Medicines that cause dizziness or drowsiness. These may include: Medicines for your blood pressure, heart, anxiety, insomnia, or swelling (edema). Pain medicines. Muscle relaxants. Other risk factors include: Drinking alcohol. Having had a fall in the  past. Having foot pain or wearing improper footwear. Working at a dangerous job. Having any of the following in your home: Tripping hazards, such as floor clutter or loose rugs. Poor lighting. Pets. Having dementia or memory loss. What actions can I take to lower my risk of falling?     Physical activity Stay physically fit. Do strength and balance exercises. Consider taking a regular class to build strength and balance. Yoga and tai chi are good options. Vision Have your eyes checked every year and your prescription for glasses or contacts updated as needed. Shoes and walking aids Wear non-skid shoes. Wear shoes that have rubber soles and low heels. Do not wear high heels. Do not walk around the house in socks or slippers. Use a cane or walker as told by your provider. Home safety Attach secure railings on both sides of your stairs. Install grab bars for your bathtub, shower, and toilet. Use a non-skid mat in your bathtub or shower. Attach bath mats securely with double-sided, non-slip rug tape. Use good lighting in all rooms. Keep a flashlight near your bed. Make sure there is a clear path from your bed to the bathroom. Use night-lights. Do not use throw rugs. Make sure all carpeting is taped or tacked down securely. Remove all clutter from walkways and stairways, including extension cords. Repair uneven or broken steps and floors. Avoid walking on icy or slippery surfaces. Walk on the grass instead of on icy or slick sidewalks. Use ice melter to get rid of ice on walkways in the winter. Use a cordless phone. Questions to ask your health care provider Can you help me check my risk for a fall? Do any of my medicines make me more likely to fall? Should I take a vitamin D supplement? What exercises can I do to improve my strength and balance? Should I make an appointment to have my vision checked? Do I need a bone density test to check for weak bones (osteoporosis)? Would it  help to use a cane or a walker? Where to find more information Centers for Disease Control and Prevention, STEAD[employer]: TonerPromos.no Community-Based Fall Prevention Programs: TonerPromos.no [employer] on Aging: BaseRingTones.pl Contact a health care provider if: You fall at home. You are afraid of falling at home. You feel weak, drowsy, or dizzy. This information is not intended to replace advice given to you by your health care provider. Make sure you discuss any questions you have with your health care provider. Document Revised: 10/14/2021 Document Reviewed: 10/14/2021 Elsevier Patient Education  2024 [employer].

## 2022-12-30 ENCOUNTER — Ambulatory Visit (INDEPENDENT_AMBULATORY_CARE_PROVIDER_SITE_OTHER): Payer: Medicare PPO | Admitting: Obstetrics and Gynecology

## 2022-12-30 ENCOUNTER — Encounter: Payer: Self-pay | Admitting: Obstetrics and Gynecology

## 2022-12-30 VITALS — BP 110/72 | HR 95 | Ht 64.0 in | Wt 103.0 lb

## 2022-12-30 DIAGNOSIS — N952 Postmenopausal atrophic vaginitis: Secondary | ICD-10-CM | POA: Diagnosis not present

## 2022-12-30 DIAGNOSIS — M810 Age-related osteoporosis without current pathological fracture: Secondary | ICD-10-CM | POA: Diagnosis not present

## 2022-12-30 DIAGNOSIS — Z01419 Encounter for gynecological examination (general) (routine) without abnormal findings: Secondary | ICD-10-CM | POA: Diagnosis not present

## 2022-12-30 DIAGNOSIS — N898 Other specified noninflammatory disorders of vagina: Secondary | ICD-10-CM

## 2022-12-30 MED ORDER — ROMOSOZUMAB-AQQG 105 MG/1.17ML ~~LOC~~ SOSY
210.0000 mg | PREFILLED_SYRINGE | Freq: Once | SUBCUTANEOUS | Status: AC
  Administered 2024-01-29: 210 mg via SUBCUTANEOUS

## 2022-12-30 MED ORDER — DENOSUMAB 60 MG/ML ~~LOC~~ SOSY
60.0000 mg | PREFILLED_SYRINGE | Freq: Once | SUBCUTANEOUS | Status: AC
Start: 2022-12-30 — End: ?

## 2022-12-30 NOTE — Progress Notes (Addendum)
82 y.o. y.o. female here for annual exam. She denies any PM bleeding.   Patient's last menstrual period was 02/25/1992. 82 y.o. G2P2002 Divorced White or Caucasian Declined female here for annual exam.  No vaginal bleeding. No bowel or bladder issues.    DEXA from 06/07/21 with a T score of -3.9. Her low T score was in her forearm. Part of her spine was excluded secondary to degenerative changes. Her next lowest T score was -2.9 in her femur. She was started on Fosamax in 4/23 (started in 5/23).  Does have some reflux She is getting calcium and vit. Exercises regularly.  Has 15 acres and mows her yard. 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  Her only medication is Keppra for one possible seizure over 5 years ago.    She walks 2 miles a day. Some weights as well.  Patient does complain of discharge today.  Patient's last menstrual period was 02/25/1992.          Sexually active: No.  The current method of family planning is post menopausal status.    Exercising: Yes.    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  No results found for: "DIAGPAP", "HPVHIGH", "ADEQPAP"  GYN HISTORY: No results found for: "DIAGPAP", "HPVHIGH", "ADEQPAP"  OB History  Gravida Para Term Preterm AB Living  2 2 2  0 0 2  SAB IAB Ectopic Multiple Live Births          2    # Outcome Date GA Lbr Len/2nd Weight Sex Type Anes PTL Lv  2 Term      Vag-Spont   LIV  1 Term      Vag-Spont   LIV    Past Medical History:   Diagnosis Date   Allergic rhinitis    Convulsions (HCC)    Diverticulosis    Heart murmur    MVP (mitral valve prolapse)    Osteopenia    Intolerant of Fosamax    Past Surgical History:  Procedure Laterality Date   TUBAL LIGATION      Current Outpatient Medications on File Prior to Visit  Medication Sig Dispense Refill   alendronate (FOSAMAX) 70 MG tablet Take 1 tablet (70 mg total) by mouth every 7 (seven) days. Take first thing in am with 6 oz. Water.  Be upright after taking.  Eat nothing for one hour. 12 tablet 3   levETIRAcetam (KEPPRA) 500 MG tablet Take 1 tablet (500 mg total) by mouth 2 (two) times daily. 180 tablet 4   Multiple Vitamins-Minerals (MULTIVITAMIN PO) Take by mouth daily as needed (for supplement).      No current facility-administered medications on file prior to visit.    Social History   Socioeconomic History   Marital status: Divorced    Spouse name: Not on file   Number of children: 2   Years of education: 16   Highest education level: Not on file  Occupational History   Occupation: Programmer, applications  Comment: [employer]  Tobacco Use   Smoking status: Never   Smokeless tobacco: Never  Vaping Use   Vaping status: Never Used  Substance and Sexual Activity   Alcohol use: No   Drug use: No   Sexual activity: Not Currently    Partners: Male    Birth control/protection: Post-menopausal    Comment: older than 16, less than 5  Other Topics Concern   Not on file  Social History Narrative   Lives alone   Caffeine use-none   Social Determinants of Health   Financial Resource Strain: Low Risk  (11/19/2022)   Overall Financial Resource Strain (CARDIA)    Difficulty of Paying Living Expenses: Not hard at all  Food Insecurity: No Food Insecurity (11/19/2022)   Hunger Vital Sign    Worried About Running Out of Food in the Last Year: Never true    Ran Out of Food in the Last Year: Never true  Transportation Needs: No Transportation  Needs (11/19/2022)   PRAPARE - Administrator, Civil Service (Medical): No    Lack of Transportation (Non-Medical): No  Physical Activity: Sufficiently Active (11/19/2022)   Exercise Vital Sign    Days of Exercise per Week: 7 days    Minutes of Exercise per Session: 30 min  Stress: No Stress Concern Present (11/19/2022)   [employer] of Occupational Health - Occupational Stress Questionnaire    Feeling of Stress : Not at all  Social Connections: Moderately Integrated (11/19/2022)   Social Connection and Isolation Panel [NHANES]    Frequency of Communication with Friends and Family: More than three times a week    Frequency of Social Gatherings with Friends and Family: More than three times a week    Attends Religious Services: More than 4 times per year    Active Member of [employer] or Organizations: Yes    Attends Engineer, structural: More than 4 times per year    Marital Status: Divorced  Intimate Partner Violence: Not At Risk (11/19/2022)   Humiliation, Afraid, Rape, and Kick questionnaire    Fear of Current or Ex-Partner: No    Emotionally Abused: No    Physically Abused: No    Sexually Abused: No    Family History  Problem Relation Age of Onset   Stroke Mother    Diabetes Mother    Hypertension Mother    Colon cancer Father    Breast cancer Sister 79     No Known Allergies    Patient's last menstrual period was Patient's last menstrual period was 02/25/1992.Marland Kitchen            Review of Systems Alls systems reviewed and are negative.     Physical Exam Constitutional:      Appearance: Normal appearance.  Genitourinary:     Vulva and urethral meatus normal.     No lesions in the vagina.     Right Labia: No rash, lesions or skin changes.    Left Labia: No lesions, skin changes or rash.    No vaginal discharge or tenderness.     No vaginal prolapse present.    Moderate vaginal atrophy present.     Right Adnexa: not tender, not palpable and no  mass present.    Left Adnexa: not tender, not palpable and no mass present.    No cervical motion tenderness or discharge.     Uterus is not enlarged, tender or irregular.     Uterus is anteverted.  Breasts:  Right: Normal.     Left: Normal.  HENT:     Head: Normocephalic.  Neck:     Thyroid: No thyroid mass, thyromegaly or thyroid tenderness.  Cardiovascular:     Rate and Rhythm: Normal rate and regular rhythm.     Heart sounds: Normal heart sounds, S1 normal and S2 normal.  Pulmonary:     Effort: Pulmonary effort is normal.     Breath sounds: Normal breath sounds and air entry.  Abdominal:     General: There is no distension.     Palpations: Abdomen is soft. There is no mass.     Tenderness: There is no abdominal tenderness. There is no guarding or rebound.  Musculoskeletal:        General: Normal range of motion.     Cervical back: Full passive range of motion without pain, normal range of motion and neck supple. No tenderness.     Right lower leg: No edema.     Left lower leg: No edema.  Neurological:     Mental Status: She is alert.  Skin:    General: Skin is warm.  Psychiatric:        Mood and Affect: Mood normal.        Behavior: Behavior normal.        Thought Content: Thought content normal.  Vitals and nursing note reviewed. Exam conducted with a chaperone present.      A:         Well Woman GYN exam                             P:        Pap smear not indicated Encouraged annual mammogram screening Colon cancer screening up-to-date DXA up-to-date Counseled on smoking cessation and alcohol reduction To check calcium, pth, vit d level, GFR Encouraged vit D and calcium 1200mg  daily(can subtract daily food source)  Discussed advantage of evenity to build bones and improve bone mineral density and lower risk of fractures by also decreasing bone resorption at the same time.  Discussed this is monthly for a year and prolia is Q 6 months injection  Prolia should  be continued and risk of stopping increased risk of vetebral fractures.  If insurance does not cover evenity, we can start prolia, but would be better to start with evenity.  Continue Fosomax until told of plan of care and insurance coverage.  She would like to do infusions at a cancer center near her house.  Discussed she will need monitoring for her calcium and vit D while taking the medication Q 6 months, as a side effect is hypocalcemia.  Most common side effect with evenity is headaches and joint pain.  She cannot take if she has chronic kidney disease. Counseled on weight bearing exercises with walking, weight bearing, swimming which is doing. Discussed maximum time of use of fosomax is 3-5 years with a holiday She would like to see if her insurance will help cover evenity or prolia Labs and immunizations to do with PMD Discussed breast self exams Encouraged healthy lifestyle practices Encouraged Vit D and Calcium   No follow-ups on file.  15 minutes spent on reviewing records, imaging,  and one on one patient time and counseling patient and documentation Dr. Judith Blonder

## 2022-12-30 NOTE — Addendum Note (Signed)
Addended by: Earley Favor on: 12/30/2022 04:12 PM   Modules accepted: Orders

## 2022-12-31 DIAGNOSIS — N898 Other specified noninflammatory disorders of vagina: Secondary | ICD-10-CM | POA: Diagnosis not present

## 2022-12-31 LAB — WET PREP FOR TRICH, YEAST, CLUE

## 2023-01-06 ENCOUNTER — Telehealth: Payer: Self-pay | Admitting: *Deleted

## 2023-01-06 DIAGNOSIS — M810 Age-related osteoporosis without current pathological fracture: Secondary | ICD-10-CM

## 2023-01-06 NOTE — Telephone Encounter (Signed)
-----   Message from Earley Favor sent at 01/01/2023  2:05 PM EST ----- Bennett Scrape ----- Message ----- From: Jetta Lout, RN Sent: 01/01/2023  11:06 AM EST To: Earley Favor, MD  What patient is this for?  Thanks,  Irving Burton ----- Message ----- From: Earley Favor, MD Sent: 12/30/2022   3:04 PM EST To: Jetta Lout, RN  Can you run a PA for evenity and prolia Dr. Karma Greaser

## 2023-01-06 NOTE — Telephone Encounter (Signed)
 Insurance information submitted to [employer] portal. Will await summary of benefits for Evenity.

## 2023-01-12 ENCOUNTER — Other Ambulatory Visit: Payer: Self-pay | Admitting: Family Medicine

## 2023-01-12 DIAGNOSIS — Z1231 Encounter for screening mammogram for malignant neoplasm of breast: Secondary | ICD-10-CM

## 2023-01-28 NOTE — Telephone Encounter (Signed)
PA for evenity submitted via covermymeds. Key: [id number].

## 2023-02-02 ENCOUNTER — Other Ambulatory Visit: Payer: Self-pay

## 2023-02-02 DIAGNOSIS — Z8739 Personal history of other diseases of the musculoskeletal system and connective tissue: Secondary | ICD-10-CM

## 2023-02-02 NOTE — Telephone Encounter (Signed)
Med refill request: fosamax Last AEX: 12/30/2022 Next AEX: not yet scheduled Last MMG (if hormonal med) Refill authorized: Last Rx sent #12 with 3 refills on 12/27/2021. Please approve or deny as appropriate.

## 2023-02-02 NOTE — Telephone Encounter (Signed)
LVM in refill line requesting refill on fosamax.

## 2023-02-03 ENCOUNTER — Encounter: Payer: Self-pay | Admitting: *Deleted

## 2023-02-03 MED ORDER — ALENDRONATE SODIUM 70 MG PO TABS: 70.0000 mg | ORAL_TABLET | ORAL | 0 refills | Status: AC

## 2023-02-03 NOTE — Telephone Encounter (Signed)
PA for Evenity denied by [facility]. Medical Necessity Letter of Appeal for Evenity signed by Dr. Karma Greaser and faxed to [facility] and Appeals Department at [phone]. Will await response.

## 2023-02-10 NOTE — Telephone Encounter (Addendum)
 Deductible:  no   OOP MAX: no   Annual exam: 12-30-22  Calcium:   10         Date: 02/24/23 GFR:        70          Date: 02/24/24  Upcoming dental procedures: No   Hx of Kidney Disease: No   Heart attack or stroke in the last year: No  Last Bone Density Scan: 11-17-22   Is Prior Authorization needed: yes, approved 01/29/23 to 02/24/24. Reference #: [id number]  Pt estimated Cost: $40/month   Spoke with patient, advised once labs back and reviewed by Dr. Karma Greaser would call to schedule Evenity. Patient requested to schedule blood work after the holidays.

## 2023-02-10 NOTE — Telephone Encounter (Signed)
Appeal received from [facility].   PA approved for Evenity 01/29/23 to 02/24/24. Reference #: [id number].

## 2023-02-11 ENCOUNTER — Ambulatory Visit
Admission: RE | Admit: 2023-02-11 | Discharge: 2023-02-11 | Disposition: A | Discharge status: Home / Self Care | Payer: Medicare PPO | Source: Ambulatory Visit | Attending: Family Medicine | Admitting: Family Medicine

## 2023-02-11 DIAGNOSIS — Z1231 Encounter for screening mammogram for malignant neoplasm of breast: Secondary | ICD-10-CM

## 2023-02-16 ENCOUNTER — Other Ambulatory Visit: Payer: Self-pay | Admitting: Family Medicine

## 2023-02-16 DIAGNOSIS — R928 Other abnormal and inconclusive findings on diagnostic imaging of breast: Secondary | ICD-10-CM

## 2023-02-24 ENCOUNTER — Other Ambulatory Visit: Payer: Medicare PPO

## 2023-02-24 DIAGNOSIS — Z13228 Encounter for screening for other metabolic disorders: Secondary | ICD-10-CM | POA: Diagnosis not present

## 2023-02-25 LAB — COMPLETE METABOLIC PANEL WITH GFR
AG Ratio: 1.3 (calc) (ref 1.0–2.5)
ALT: 28 U/L (ref 6–29)
AST: 21 U/L (ref 10–35)
Albumin: 4.1 g/dL (ref 3.6–5.1)
Alkaline phosphatase (APISO): 110 U/L (ref 37–153)
BUN: 14 mg/dL (ref 7–25)
CO2: 30 mmol/L (ref 20–32)
Calcium: 10 mg/dL (ref 8.6–10.4)
Chloride: 100 mmol/L (ref 98–110)
Creat: 0.83 mg/dL (ref 0.60–0.95)
Globulin: 3.2 g/dL (ref 1.9–3.7)
Glucose, Bld: 105 mg/dL — ABNORMAL HIGH (ref 65–99)
Potassium: 4.4 mmol/L (ref 3.5–5.3)
Sodium: 139 mmol/L (ref 135–146)
Total Bilirubin: 0.7 mg/dL (ref 0.2–1.2)
Total Protein: 7.3 g/dL (ref 6.1–8.1)
eGFR: 70 mL/min/{1.73_m2} (ref >=60)

## 2023-03-03 ENCOUNTER — Ambulatory Visit
Admission: RE | Admit: 2023-03-03 | Discharge: 2023-03-03 | Disposition: A | Discharge status: Home / Self Care | Payer: Medicare PPO | Source: Ambulatory Visit | Attending: Family Medicine | Admitting: Family Medicine

## 2023-03-03 ENCOUNTER — Other Ambulatory Visit: Payer: Self-pay | Admitting: Family Medicine

## 2023-03-03 DIAGNOSIS — R928 Other abnormal and inconclusive findings on diagnostic imaging of breast: Secondary | ICD-10-CM

## 2023-03-03 DIAGNOSIS — N6325 Unspecified lump in the left breast, overlapping quadrants: Secondary | ICD-10-CM | POA: Diagnosis not present

## 2023-03-03 DIAGNOSIS — N6320 Unspecified lump in the left breast, unspecified quadrant: Secondary | ICD-10-CM

## 2023-03-03 DIAGNOSIS — R921 Mammographic calcification found on diagnostic imaging of breast: Secondary | ICD-10-CM

## 2023-03-10 ENCOUNTER — Other Ambulatory Visit: Payer: Medicare PPO

## 2023-03-12 ENCOUNTER — Other Ambulatory Visit: Payer: Self-pay | Admitting: Family Medicine

## 2023-03-12 DIAGNOSIS — Z8669 Personal history of other diseases of the nervous system and sense organs: Secondary | ICD-10-CM

## 2023-04-27 MED ORDER — ROMOSOZUMAB-AQQG 105 MG/1.17ML ~~LOC~~ SOSY
210.0000 mg | PREFILLED_SYRINGE | Freq: Once | SUBCUTANEOUS | Status: AC
Start: 2023-05-05 — End: 2023-05-05
  Administered 2023-05-05: 210 mg via SUBCUTANEOUS

## 2023-04-27 NOTE — Telephone Encounter (Signed)
 Call to patient. Benefits rerun for new year and advised same as reviewed before. $40 copay per month for Evenity. PA has been approved through 02/24/24. No medical history updates. Patient agreeable to scheduled. Patient scheduled for 05/05/23 at 1345. Patient agreeable to date and time of appointment. Aware to report any redness at injection site to the office. Order placed and Summary of benefits and PA scanned into Epic.   Encounter closed.

## 2023-04-27 NOTE — Telephone Encounter (Signed)
 Insurance information submitted to [employer] portal. Will await summary of benefits for Evenity.

## 2023-05-04 ENCOUNTER — Ambulatory Visit: Payer: BC Managed Care – PPO | Admitting: Family Medicine

## 2023-05-05 ENCOUNTER — Ambulatory Visit

## 2023-05-05 DIAGNOSIS — M810 Age-related osteoporosis without current pathological fracture: Secondary | ICD-10-CM | POA: Diagnosis not present

## 2023-05-14 ENCOUNTER — Encounter: Payer: Self-pay | Admitting: Family Medicine

## 2023-05-14 ENCOUNTER — Ambulatory Visit: Admitting: Family Medicine

## 2023-05-14 VITALS — BP 147/77 | HR 92 | Ht 65.0 in | Wt 103.0 lb

## 2023-05-14 DIAGNOSIS — E038 Other specified hypothyroidism: Secondary | ICD-10-CM

## 2023-05-14 DIAGNOSIS — R7301 Impaired fasting glucose: Secondary | ICD-10-CM

## 2023-05-14 DIAGNOSIS — Z8669 Personal history of other diseases of the nervous system and sense organs: Secondary | ICD-10-CM

## 2023-05-14 DIAGNOSIS — E7849 Other hyperlipidemia: Secondary | ICD-10-CM | POA: Diagnosis not present

## 2023-05-14 DIAGNOSIS — E559 Vitamin D deficiency, unspecified: Secondary | ICD-10-CM | POA: Diagnosis not present

## 2023-05-14 DIAGNOSIS — R7303 Prediabetes: Secondary | ICD-10-CM | POA: Diagnosis not present

## 2023-05-14 NOTE — Assessment & Plan Note (Signed)
 Stable No complaints or concerns

## 2023-05-14 NOTE — Progress Notes (Signed)
 Established Patient Office Visit  Subjective:  Patient ID: Heidi Vasquez, female    DOB: 12-Jul-1940  Age: 83 y.o. MRN: [id number]  CC:  Chief Complaint  Patient presents with   Care Management    HPI Heidi Vasquez is a 83 y.o. female with past medical history of seizure disorder, prediabetes presents for f/u of  chronic medical conditions. For the details of today's visit, please refer to the assessment and plan.     Past Medical History:  Diagnosis Date   Allergic rhinitis    Convulsions (HCC)    Diverticulosis    Heart murmur    MVP (mitral valve prolapse)    Osteopenia    Intolerant of Fosamax    Past Surgical History:  Procedure Laterality Date   TUBAL LIGATION      Family History  Problem Relation Age of Onset   Stroke Mother    Diabetes Mother    Hypertension Mother    Colon cancer Father    Breast cancer Sister 70    Social History   Socioeconomic History   Marital status: Divorced    Spouse name: Not on file   Number of children: 2   Years of education: 16   Highest education level: Not on file  Occupational History   Occupation: Trial Coordinator    Comment: Scientific laboratory technician  Tobacco Use   Smoking status: Never   Smokeless tobacco: Never  Vaping Use   Vaping status: Never Used  Substance and Sexual Activity   Alcohol use: No   Drug use: No   Sexual activity: Not Currently    Partners: Male    Birth control/protection: Post-menopausal    Comment: older than 16, less than 5  Other Topics Concern   Not on file  Social History Narrative   Lives alone   Caffeine use-none   Social Drivers of Health   Financial Resource Strain: Low Risk  (11/19/2022)   Overall Financial Resource Strain (CARDIA)    Difficulty of Paying Living Expenses: Not hard at all  Food Insecurity: No Food Insecurity (11/19/2022)   Hunger Vital Sign    Worried About Running Out of Food in the Last Year: Never true    Ran Out of Food in the Last Year: Never true   Transportation Needs: No Transportation Needs (11/19/2022)   PRAPARE - Administrator, Civil Service (Medical): No    Lack of Transportation (Non-Medical): No  Physical Activity: Sufficiently Active (11/19/2022)   Exercise Vital Sign    Days of Exercise per Week: 7 days    Minutes of Exercise per Session: 30 min  Stress: No Stress Concern Present (11/19/2022)   [employer] of Occupational Health - Occupational Stress Questionnaire    Feeling of Stress : Not at all  Social Connections: Moderately Integrated (11/19/2022)   Social Connection and Isolation Panel [NHANES]    Frequency of Communication with Friends and Family: More than three times a week    Frequency of Social Gatherings with Friends and Family: More than three times a week    Attends Religious Services: More than 4 times per year    Active Member of [employer] or Organizations: Yes    Attends Banker Meetings: More than 4 times per year    Marital Status: Divorced  Intimate Partner Violence: Not At Risk (11/19/2022)   Humiliation, Afraid, Rape, and Kick questionnaire    Fear of Current or Ex-Partner: No    Emotionally  Abused: No    Physically Abused: No    Sexually Abused: No    Outpatient Medications Prior to Visit  Medication Sig Dispense Refill   levETIRAcetam (KEPPRA) 500 MG tablet Take 1 tablet (500 mg total) by mouth 2 (two) times daily. 180 tablet 4   Multiple Vitamins-Minerals (MULTIVITAMIN PO) Take by mouth daily as needed (for supplement).      alendronate (FOSAMAX) 70 MG tablet Take 1 tablet (70 mg total) by mouth every 7 (seven) days. Take first thing in am with 6 oz. Water.  Be upright after taking.  Eat nothing for one hour. (Patient not taking: Reported on 05/14/2023) 12 tablet 0   Facility-Administered Medications Prior to Visit  Medication Dose Route Frequency Provider Last Rate Last Admin   denosumab (PROLIA) injection 60 mg  60 mg Subcutaneous Once        Romosozumab-aqqg  (EVENITY) 105 MG/1. injection 210 mg  210 mg Subcutaneous Once         No Known Allergies  ROS Review of Systems  Constitutional:  Negative for chills and fever.  Eyes:  Negative for visual disturbance.  Respiratory:  Negative for chest tightness and shortness of breath.   Neurological:  Negative for dizziness and headaches.      Objective:    Physical Exam HENT:     Head: Normocephalic.     Mouth/Throat:     Mouth: Mucous membranes are moist.  Cardiovascular:     Rate and Rhythm: Normal rate.     Heart sounds: Normal heart sounds.  Pulmonary:     Effort: Pulmonary effort is normal.     Breath sounds: Normal breath sounds.  Neurological:     Mental Status: She is alert.     BP (!) 147/77 (BP Location: Left Arm, Patient Position: Sitting, Cuff Size: Normal)   Pulse 92   Ht 5\' 5"  (1.651 m)   Wt 103 lb (46.7 kg)   LMP 02/25/1992   SpO2 92%   BMI 17.14 kg/m  Wt Readings from Last 3 Encounters:  05/14/23 103 lb (46.7 kg)  12/30/22 103 lb (46.7 kg)  11/19/22 104 lb (47.2 kg)    Lab Results  Component Value Date   TSH 3.080 05/06/2022   Lab Results  Component Value Date   WBC 8.0 05/06/2022   HGB 13.0 05/06/2022   HCT 39.4 05/06/2022   MCV 93 05/06/2022   PLT 280 05/06/2022   Lab Results  Component Value Date   NA 139 02/24/2023   K 4.4 02/24/2023   CO2 30 02/24/2023   GLUCOSE 105 (H) 02/24/2023   BUN 14 02/24/2023   CREATININE 0.83 02/24/2023   BILITOT 0.7 02/24/2023   ALKPHOS 103 05/06/2022   AST 21 02/24/2023   ALT 28 02/24/2023   PROT 7.3 02/24/2023   ALBUMIN 4.2 05/06/2022   CALCIUM 10.0 02/24/2023   EGFR 70 02/24/2023   Lab Results  Component Value Date   CHOL 165 05/06/2022   Lab Results  Component Value Date   HDL 71 05/06/2022   Lab Results  Component Value Date   LDLCALC 80 05/06/2022   Lab Results  Component Value Date   TRIG 74 05/06/2022   Lab Results  Component Value Date   CHOLHDL 2.3 05/06/2022   Lab Results   Component Value Date   HGBA1C 6.1 (H) 05/06/2022      Assessment & Plan:  Prediabetes Assessment & Plan: Lifestyle modifications for prediabetes were discussed, including adopting a heart-healthy diet  and increasing physical activity. The patient was encouraged to decrease her intake of high-sugar foods and beverages. She verbalized understanding and is aware of the plan of care.    Hx of seizure disorder Assessment & Plan: Stable No complaints or concerns   IFG (impaired fasting glucose) -     Hemoglobin A1c  Vitamin D deficiency -     VITAMIN D 25 Hydroxy (Vit-D Deficiency, Fractures)  TSH (thyroid-stimulating hormone deficiency) -     TSH + free T4  Other hyperlipidemia -     Lipid panel -     CMP14+EGFR -     CBC with Differential/Platelet  Note: This chart has been completed using Engineer, civil (consulting) software, and while attempts have been made to ensure accuracy, certain words and phrases may not be transcribed as intended.    Follow-up: Return in about 7 months (around 12/14/2023).   Gilmore Laroche, FNP

## 2023-05-14 NOTE — Assessment & Plan Note (Signed)
 Lifestyle modifications for prediabetes were discussed, including adopting a heart-healthy diet and increasing physical activity. The patient was encouraged to decrease her intake of high-sugar foods and beverages. She verbalized understanding and is aware of the plan of care.

## 2023-05-14 NOTE — Patient Instructions (Signed)
 I appreciate the opportunity to provide care to you today!    Follow up:  7 months  Labs: please stop by the lab 2-3 before your appointment to get your blood drawn (CBC, CMP, TSH, Lipid profile, HgA1c, Vit D)  Attached with your AVS, you will find valuable resources for self-education. I highly recommend dedicating some time to thoroughly examine them.   Please continue to a heart-healthy diet and increase your physical activities. Try to exercise for at least five days a week.    It was a pleasure to see you and I look forward to continuing to work together on your health and well-being. Please do not hesitate to call the office if you need care or have questions about your care.  In case of emergency, please visit the Emergency Department for urgent care, or contact our clinic at [phone] to schedule an appointment. We're here to help you!   Have a wonderful day and week. With Gratitude, Gilmore Laroche MSN, FNP-BC

## 2023-06-09 ENCOUNTER — Telehealth: Payer: Self-pay | Admitting: *Deleted

## 2023-06-09 DIAGNOSIS — M810 Age-related osteoporosis without current pathological fracture: Secondary | ICD-10-CM

## 2023-06-09 MED ORDER — ROMOSOZUMAB-AQQG 105 MG/1.17ML ~~LOC~~ SOSY
210.0000 mg | PREFILLED_SYRINGE | Freq: Once | SUBCUTANEOUS | Status: AC
  Administered 2023-06-17: 210 mg via SUBCUTANEOUS

## 2023-06-09 NOTE — Telephone Encounter (Signed)
 Call to patient. No medical history updates or dental procedures scheduled. Approval for Evenity up to date and on file through 02-24-24. Labs up to date. Patient scheduled for 2nd Evenity injection on 06-17-23 at 1345. Patient agreeable to date and time of appointment. Advised at that appointment, could schedule Evenity injections through June and then RN would need to run benefits through insurance again.   Order placed. Summary of benefits and PA scanned into Epic. Encounter closed.

## 2023-06-09 NOTE — Telephone Encounter (Signed)
 Call to [facility], spoke with Geralyn Knee on clinical intake team. Evenity PA on file through 02-24-24. Auth #: [id number]. Will fax copy to our office.

## 2023-06-17 ENCOUNTER — Ambulatory Visit

## 2023-06-17 DIAGNOSIS — M810 Age-related osteoporosis without current pathological fracture: Secondary | ICD-10-CM | POA: Diagnosis not present

## 2023-06-17 NOTE — Progress Notes (Signed)
 Patient is in office today for a nurse visit for  evenity  . Patient Injection was given in the  Right arm. Patient tolerated injection well.

## 2023-07-01 ENCOUNTER — Other Ambulatory Visit: Payer: Self-pay | Admitting: *Deleted

## 2023-07-01 DIAGNOSIS — M810 Age-related osteoporosis without current pathological fracture: Secondary | ICD-10-CM

## 2023-07-01 MED ORDER — ROMOSOZUMAB-AQQG 105 MG/1.17ML ~~LOC~~ SOSY
210.0000 mg | PREFILLED_SYRINGE | SUBCUTANEOUS | Status: AC
  Administered 2023-07-23 – 2023-10-23 (×3): 210 mg via SUBCUTANEOUS

## 2023-07-23 ENCOUNTER — Ambulatory Visit (INDEPENDENT_AMBULATORY_CARE_PROVIDER_SITE_OTHER)

## 2023-07-23 DIAGNOSIS — M810 Age-related osteoporosis without current pathological fracture: Secondary | ICD-10-CM

## 2023-07-23 NOTE — Progress Notes (Addendum)
 Patient in today for third evenity  injection. Patient's initial calcium level was obtained on 02/24/23.  Result: 10 GRF 02/24/23 results: 70.  Last AEX: 12/30/22 Last BMD: 11/17/22  Injection given in left arm.  Patient tolerated injection well.  Routed to provider for review.

## 2023-08-31 ENCOUNTER — Ambulatory Visit
Admission: RE | Admit: 2023-08-31 | Discharge: 2023-08-31 | Disposition: A | Discharge status: Home / Self Care | Payer: Medicare PPO | Source: Ambulatory Visit | Attending: Family Medicine | Admitting: Family Medicine

## 2023-08-31 ENCOUNTER — Other Ambulatory Visit: Payer: Self-pay | Admitting: Family Medicine

## 2023-08-31 ENCOUNTER — Ambulatory Visit: Payer: Self-pay | Admitting: Obstetrics and Gynecology

## 2023-08-31 DIAGNOSIS — N6320 Unspecified lump in the left breast, unspecified quadrant: Secondary | ICD-10-CM

## 2023-08-31 DIAGNOSIS — R928 Other abnormal and inconclusive findings on diagnostic imaging of breast: Secondary | ICD-10-CM | POA: Diagnosis not present

## 2023-08-31 DIAGNOSIS — R921 Mammographic calcification found on diagnostic imaging of breast: Secondary | ICD-10-CM

## 2023-09-10 ENCOUNTER — Telehealth: Payer: Self-pay | Admitting: *Deleted

## 2023-09-10 NOTE — Telephone Encounter (Signed)
 Call to patient. Patient states she spoke with Arleene at [facility] and provided code of [phone] and was advised she would only have a $40 copay for the Evenity  injection. Patient states she will keep appointment as scheduled for 09/11/23.   Encounter closed.

## 2023-09-11 ENCOUNTER — Ambulatory Visit (INDEPENDENT_AMBULATORY_CARE_PROVIDER_SITE_OTHER)

## 2023-09-11 DIAGNOSIS — M810 Age-related osteoporosis without current pathological fracture: Secondary | ICD-10-CM

## 2023-09-11 NOTE — Progress Notes (Signed)
 4th Evenity  injections given SQ right arm.  Patient tolerated injections well. Annual exam: 12-30-22 EB Calcium:   10         Date: 02/24/23 GFR:        70          Date: 02/24/24  Upcoming dental procedures: No  Hx of Kidney Disease: No  Heart attack or stroke in the last year: No Last Bone Density Scan: 11-17-22

## 2023-10-01 DIAGNOSIS — H52203 Unspecified astigmatism, bilateral: Secondary | ICD-10-CM | POA: Diagnosis not present

## 2023-10-01 DIAGNOSIS — Z961 Presence of intraocular lens: Secondary | ICD-10-CM | POA: Diagnosis not present

## 2023-10-01 DIAGNOSIS — H2511 Age-related nuclear cataract, right eye: Secondary | ICD-10-CM | POA: Diagnosis not present

## 2023-10-08 ENCOUNTER — Encounter: Payer: Self-pay | Admitting: *Deleted

## 2023-10-23 ENCOUNTER — Ambulatory Visit (INDEPENDENT_AMBULATORY_CARE_PROVIDER_SITE_OTHER)

## 2023-10-23 DIAGNOSIS — M810 Age-related osteoporosis without current pathological fracture: Secondary | ICD-10-CM

## 2023-11-24 ENCOUNTER — Ambulatory Visit: Payer: BC Managed Care – PPO

## 2023-11-24 VITALS — Ht 65.5 in | Wt 102.0 lb

## 2023-11-24 DIAGNOSIS — Z0000 Encounter for general adult medical examination without abnormal findings: Secondary | ICD-10-CM

## 2023-11-24 NOTE — Progress Notes (Signed)
 Subjective:   Heidi Vasquez is a 83 y.o. who presents for a Medicare Wellness preventive visit.  As a reminder, Annual Wellness Visits don't include a physical exam, and some assessments may be limited, especially if this visit is performed virtually. We may recommend an in-person follow-up visit with your provider if needed.  Visit Complete: Virtual I connected with  Heidi Vasquez on 11/24/23 by a video and audio enabled telemedicine application and verified that I am speaking with the correct person using two identifiers.  Patient Location: Home  Provider Location: Home Office  I discussed the limitations of evaluation and management by telemedicine. The patient expressed understanding and agreed to proceed.  Vital Signs: Because this visit was a virtual/telehealth visit, some criteria may be missing or patient reported. Any vitals not documented were not able to be obtained and vitals that have been documented are patient reported.  Persons Participating in Visit: Patient.  AWV Questionnaire: No: Patient Medicare AWV questionnaire was not completed prior to this visit.  Cardiac Risk Factors include: advanced age (>79men, >41 women)     Objective:    Today's Vitals   11/24/23 0924  Weight: 102 lb (46.3 kg)  Height: 5' 5.5 (1.664 m)   Body mass index is 16.72 kg/m.     11/24/2023    9:23 AM 11/19/2022    9:12 AM 10/29/2021   10:00 AM 12/19/2015   11:27 AM 06/15/2015   11:34 AM 04/20/2015   10:37 AM  Advanced Directives  Does Patient Have a Medical Advance Directive? No No Yes Yes  Yes  Yes   Type of Advance Directive   Living will Living will;Healthcare Power of [employer] Power of [place];Living will  Healthcare Power of [place];Living will   Copy of Healthcare Power of Attorney in Chart?   No - copy requested No - copy requested   No - copy requested   Would patient like information on creating a medical advance directive? No - Patient declined No - Patient  declined         Data saved with a previous flowsheet row definition    Current Medications (verified) Outpatient Encounter Medications as of 11/24/2023  Medication Sig   levETIRAcetam  (KEPPRA ) 500 MG tablet Take 1 tablet (500 mg total) by mouth 2 (two) times daily.   Multiple Vitamins-Minerals (MULTIVITAMIN PO) Take by mouth daily as needed (for supplement).    alendronate  (FOSAMAX ) 70 MG tablet Take 1 tablet (70 mg total) by mouth every 7 (seven) days. Take first thing in am with 6 oz. Water.  Be upright after taking.  Eat nothing for one hour. (Patient not taking: Reported on 11/24/2023)   Facility-Administered Encounter Medications as of 11/24/2023  Medication   denosumab  (PROLIA ) injection 60 mg   Romosozumab -aqqg (EVENITY ) 105 MG/1. injection 210 mg    Allergies (verified) Patient has no known allergies.   History: Past Medical History:  Diagnosis Date   Allergic rhinitis    Convulsions (HCC)    Diverticulosis    Heart murmur    MVP (mitral valve prolapse)    Osteopenia    Intolerant of Fosamax    Past Surgical History:  Procedure Laterality Date   TUBAL LIGATION     Family History  Problem Relation Age of Onset   Stroke Mother    Diabetes Mother    Hypertension Mother    Colon cancer Father    Breast cancer Sister 64   Social History   Socioeconomic History  Marital status: Divorced    Spouse name: Not on file   Number of children: 2   Years of education: 97   Highest education level: Not on file  Occupational History   Occupation: Trial Coordinator    Comment: [employer]  Tobacco Use   Smoking status: Never   Smokeless tobacco: Never  Vaping Use   Vaping status: Never Used  Substance and Sexual Activity   Alcohol use: No   Drug use: No   Sexual activity: Not Currently    Partners: Male    Birth control/protection: Post-menopausal    Comment: older than 16, less than 5  Other Topics Concern   Not on file  Social History Narrative    Lives alone   Caffeine use-none   Social Drivers of Health   Financial Resource Strain: Low Risk  (11/24/2023)   Overall Financial Resource Strain (CARDIA)    Difficulty of Paying Living Expenses: Not hard at all  Food Insecurity: No Food Insecurity (11/24/2023)   Hunger Vital Sign    Worried About Running Out of Food in the Last Year: Never true    Ran Out of Food in the Last Year: Never true  Transportation Needs: No Transportation Needs (11/24/2023)   PRAPARE - Administrator, Civil Service (Medical): No    Lack of Transportation (Non-Medical): No  Physical Activity: Sufficiently Active (11/24/2023)   Exercise Vital Sign    Days of Exercise per Week: 7 days    Minutes of Exercise per Session: 30 min  Stress: No Stress Concern Present (11/24/2023)   [employer] of Occupational Health - Occupational Stress Questionnaire    Feeling of Stress: Not at all  Social Connections: Moderately Isolated (11/24/2023)   Social Connection and Isolation Panel    Frequency of Communication with Friends and Family: More than three times a week    Frequency of Social Gatherings with Friends and Family: Twice a week    Attends Religious Services: Never    Database administrator or Organizations: Yes    Attends Engineer, structural: More than 4 times per year    Marital Status: Divorced    Tobacco Counseling Counseling given: Yes    Clinical Intake:  Pre-visit preparation completed: Yes  Pain : No/denies pain     BMI - recorded: 16.72 Nutritional Risks: None Diabetes: No  Lab Results  Component Value Date   HGBA1C 6.1 (H) 05/06/2022   HGBA1C 6.0 (H) 07/01/2021     How often do you need to have someone help you when you read instructions, pamphlets, or other written materials from your doctor or pharmacy?: 1 - Never  Interpreter Needed?: No  Information entered by :: Chanda Laperle W CMA (AAMA)   Activities of Daily Living     11/24/2023    9:28 AM  In your  present state of health, do you have any difficulty performing the following activities:  Hearing? 0  Vision? 0  Difficulty concentrating or making decisions? 0  Walking or climbing stairs? 0  Dressing or bathing? 0  Doing errands, shopping? 0  Preparing Food and eating ? N  Using the Toilet? N  In the past six months, have you accidently leaked urine? N  Do you have problems with loss of bowel control? N  Managing your Medications? N  Managing your Finances? N  Housekeeping or managing your Housekeeping? N    Patient Care Team: Bacchus, Meade PEDLAR, FNP as PCP - General (Family  Medicine) Pa, [facility] Charmayne Molly, MD as Consulting Physician (Ophthalmology)  I have updated your Care Teams any recent Medical Services you may have received from other providers in the past year.     Assessment:   This is a routine wellness examination for [place].  Hearing/Vision screen Hearing Screening - Comments:: Patient denies any hearing difficulties.   Vision Screening - Comments:: Patient wears reading glasses only. Up to date with yearly exams.  Up to date with [facility]// Dr. Molly Cea   Goals Addressed               This Visit's Progress     Remain active, healthy, and independent (pt-stated)        I want to remain active, healthy, and independent and use my time wisely        Depression Screen     11/24/2023    9:30 AM 05/14/2023    2:29 PM 11/19/2022    9:18 AM 11/03/2022    1:01 PM 04/30/2022    3:20 PM 01/01/2022   11:14 AM 01/01/2022   11:13 AM  PHQ 2/9 Scores  PHQ - 2 Score 0 0 0 0 0 0 0  PHQ- 9 Score 0 0 0 0 0 0 0  Exception Documentation    Patient refusal        Fall Risk     11/24/2023    9:27 AM 05/14/2023    2:28 PM 12/30/2022    2:43 PM 11/19/2022    9:23 AM 11/03/2022    1:01 PM  Fall Risk   Falls in the past year? 0 0 0 0 0  Number falls in past yr: 0 0 0 0 0  Injury with Fall? 0 0 0 0 0  Risk for fall due to :  No Fall Risks No Fall Risks No Fall Risks No Fall Risks No Fall Risks  Follow up Falls evaluation completed;Education provided;Falls prevention discussed  Falls evaluation completed Falls prevention discussed Falls evaluation completed    MEDICARE RISK AT HOME:  Medicare Risk at Home Any stairs in or around the home?: Yes If so, are there any without handrails?: No Home free of loose throw rugs in walkways, pet beds, electrical cords, etc?: Yes Adequate lighting in your home to reduce risk of falls?: Yes Life alert?: No Use of a cane, walker or w/c?: No Grab bars in the bathroom?: No Shower chair or bench in shower?: No Elevated toilet seat or a handicapped toilet?: No  TIMED UP AND GO:  Was the test performed?  No  Cognitive Function: 6CIT completed        11/24/2023    9:29 AM 11/19/2022    9:15 AM 10/29/2021   10:02 AM  6CIT Screen  What Year? 0 points 0 points 0 points  What month? 0 points 0 points 0 points  What time? 0 points 0 points 0 points  Count back from 20 0 points 0 points 0 points  Months in reverse 0 points 0 points 0 points  Repeat phrase 0 points 0 points 0 points  Total Score 0 points 0 points 0 points    Immunizations Immunization History  Administered Date(s) Administered   Fluad Quad(high Dose 65+) 11/22/2021   INFLUENZA, HIGH DOSE SEASONAL PF 11/15/2018   Influenza Whole 12/29/2008, 11/24/2009   Pneumococcal Polysaccharide-23 02/25/2007   Tdap 10/21/2013    Screening Tests Health Maintenance  Topic Date Due   Pneumococcal Vaccine: 50+ Years (2  of 2 - PCV) 02/25/2008   Influenza Vaccine  09/25/2023   DTaP/Tdap/Td (2 - Td or Tdap) 10/22/2023   COVID-19 Vaccine (1 - 2024-25 season) Never done   Mammogram  02/11/2024   DEXA SCAN  11/16/2024   Medicare Annual Wellness (AWV)  11/23/2024   HPV VACCINES  Aged Out   Meningococcal B Vaccine  Aged Out   Zoster Vaccines- Shingrix  Discontinued    Health Maintenance Health Maintenance Due  Topic  Date Due   Pneumococcal Vaccine: 50+ Years (2 of 2 - PCV) 02/25/2008   Influenza Vaccine  09/25/2023   DTaP/Tdap/Td (2 - Td or Tdap) 10/22/2023   COVID-19 Vaccine (1 - 2024-25 season) Never done   Health Maintenance Items Addressed: Dr. Almarie Carpen, patients gynecologist, has ordered her mammogram. Patient will have flu, pneumonia, and Tdap vaccines given at [facility]  Additional Screening:  Vision Screening: Recommended annual ophthalmology exams for early detection of glaucoma and other disorders of the eye. Would you like a referral to an eye doctor? No    Dental Screening: Recommended annual dental exams for proper oral hygiene  Community Resource Referral / Chronic Care Management: CRR required this visit?  No   CCM required this visit?  No   Plan:    I have personally reviewed and noted the following in the patient's chart:   Medical and social history Use of alcohol, tobacco or illicit drugs  Current medications and supplements including opioid prescriptions. Patient is not currently taking opioid prescriptions. Functional ability and status Nutritional status Physical activity Advanced directives List of other physicians Hospitalizations, surgeries, and ER visits in previous 12 months Vitals Screenings to include cognitive, depression, and falls Referrals and appointments  In addition, I have reviewed and discussed with patient certain preventive protocols, quality metrics, and best practice recommendations. A written personalized care plan for preventive services as well as general preventive health recommendations were provided to patient.   Adama Ivins, CMA   11/24/2023   After Visit Summary: (MyChart) Due to this being a telephonic visit, the after visit summary with patients personalized plan was offered to patient via MyChart   Notes: patient refuses future Medicare AWV's

## 2023-11-24 NOTE — Patient Instructions (Signed)
 [name],  Thank you for taking the time for your Medicare Wellness Visit. I appreciate your continued commitment to your health goals. Please review the care plan we discussed, and feel free to reach out if I can assist you further.  Medicare recommends these wellness visits once per year to help you and your care team stay ahead of potential health issues. These visits are designed to focus on prevention, allowing your provider to concentrate on managing your acute and chronic conditions during your regular appointments.  Please note that Annual Wellness Visits do not include a physical exam. Some assessments may be limited, especially if the visit was conducted virtually. If needed, we may recommend a separate in-person follow-up with your provider.  Wishing you excellent health and many blessings in the year to come!  -Beverlyann Broxterman, CMA  Ongoing Care Seeing your primary care provider every 3 to 6 months helps us  monitor your health and provide consistent, personalized care.   Recommended Screenings:  Health Maintenance  Topic Date Due   Pneumococcal Vaccine for age over 39 (2 of 2 - PCV) 02/25/2008   Flu Shot  09/25/2023   DTaP/Tdap/Td vaccine (2 - Td or Tdap) 10/22/2023   COVID-19 Vaccine (1 - 2024-25 season) Never done   Breast Cancer Screening  02/11/2024   DEXA scan (bone density measurement)  11/16/2024   Medicare Annual Wellness Visit  11/23/2024   HPV Vaccine  Aged Out   Meningitis B Vaccine  Aged Out   Zoster (Shingles) Vaccine  Discontinued       11/24/2023    9:23 AM  Advanced Directives  Does Patient Have a Medical Advance Directive? No  Would patient like information on creating a medical advance directive? No - Patient declined   Advance Care Planning is important because it: Ensures you receive medical care that aligns with your values, goals, and preferences. Provides guidance to your family and loved ones, reducing the emotional burden of decision-making during  critical moments.  Vision: Annual vision screenings are recommended for early detection of glaucoma, cataracts, and diabetic retinopathy. These exams can also reveal signs of chronic conditions such as diabetes and high blood pressure.  Dental: Annual dental screenings help detect early signs of oral cancer, gum disease, and other conditions linked to overall health, including heart disease and diabetes.  Please see the attached documents for additional preventive care recommendations.

## 2023-12-10 DIAGNOSIS — R7301 Impaired fasting glucose: Secondary | ICD-10-CM | POA: Diagnosis not present

## 2023-12-10 DIAGNOSIS — E559 Vitamin D deficiency, unspecified: Secondary | ICD-10-CM | POA: Diagnosis not present

## 2023-12-10 DIAGNOSIS — E7849 Other hyperlipidemia: Secondary | ICD-10-CM | POA: Diagnosis not present

## 2023-12-10 DIAGNOSIS — E038 Other specified hypothyroidism: Secondary | ICD-10-CM | POA: Diagnosis not present

## 2023-12-11 ENCOUNTER — Ambulatory Visit: Payer: Self-pay | Admitting: Family Medicine

## 2023-12-11 LAB — CBC WITH DIFFERENTIAL/PLATELET
Basophils Absolute: 0.1 x10E3/uL (ref 0.0–0.2)
Basos: 1 %
EOS (ABSOLUTE): 0.2 x10E3/uL (ref 0.0–0.4)
Eos: 2 %
Hematocrit: 40.8 % (ref 34.0–46.6)
Hemoglobin: 13.3 g/dL (ref 11.1–15.9)
Immature Grans (Abs): 0 x10E3/uL (ref 0.0–0.1)
Immature Granulocytes: 0 %
Lymphocytes Absolute: 1.9 x10E3/uL (ref 0.7–3.1)
Lymphs: 23 %
MCH: 31.6 pg (ref 26.6–33.0)
MCHC: 32.6 g/dL (ref 31.5–35.7)
MCV: 97 fL (ref 79–97)
Monocytes Absolute: 0.5 x10E3/uL (ref 0.1–0.9)
Monocytes: 6 %
Neutrophils Absolute: 5.7 x10E3/uL (ref 1.4–7.0)
Neutrophils: 68 %
Platelets: 263 x10E3/uL (ref 150–450)
RBC: 4.21 x10E6/uL (ref 3.77–5.28)
RDW: 11.8 % (ref 11.7–15.4)
WBC: 8.4 x10E3/uL (ref 3.4–10.8)

## 2023-12-11 LAB — CMP14+EGFR
ALT: 25 IU/L (ref 0–32)
AST: 27 IU/L (ref 0–40)
Albumin: 4.2 g/dL (ref 3.7–4.7)
Alkaline Phosphatase: 123 IU/L (ref 48–129)
BUN/Creatinine Ratio: 22 (ref 12–28)
BUN: 20 mg/dL (ref 8–27)
Bilirubin Total: 0.7 mg/dL (ref 0.0–1.2)
CO2: 25 mmol/L (ref 20–29)
Calcium: 9.5 mg/dL (ref 8.7–10.3)
Chloride: 99 mmol/L (ref 96–106)
Creatinine, Ser: 0.92 mg/dL (ref 0.57–1.00)
Globulin, Total: 2.8 g/dL (ref 1.5–4.5)
Glucose: 96 mg/dL (ref 70–99)
Potassium: 4.3 mmol/L (ref 3.5–5.2)
Sodium: 140 mmol/L (ref 134–144)
Total Protein: 7 g/dL (ref 6.0–8.5)
eGFR: 62 mL/min/1.73 (ref >59)

## 2023-12-11 LAB — LIPID PANEL
Chol/HDL Ratio: 2.1 ratio (ref 0.0–4.4)
Cholesterol, Total: 166 mg/dL (ref 100–199)
HDL: 78 mg/dL (ref >39)
LDL Chol Calc (NIH): 74 mg/dL (ref 0–99)
Triglycerides: 72 mg/dL (ref 0–149)
VLDL Cholesterol Cal: 14 mg/dL (ref 5–40)

## 2023-12-11 LAB — VITAMIN D 25 HYDROXY (VIT D DEFICIENCY, FRACTURES): Vit D, 25-Hydroxy: 50.4 ng/mL (ref 30.0–100.0)

## 2023-12-11 LAB — HEMOGLOBIN A1C
Est. average glucose Bld gHb Est-mCnc: 120 mg/dL
Hgb A1c MFr Bld: 5.8 % — ABNORMAL HIGH (ref 4.8–5.6)

## 2023-12-11 LAB — TSH+FREE T4
Free T4: 1.39 ng/dL (ref 0.82–1.77)
TSH: 2.21 u[IU]/mL (ref 0.450–4.500)

## 2023-12-14 ENCOUNTER — Ambulatory Visit: Admitting: Family Medicine

## 2023-12-14 ENCOUNTER — Encounter: Payer: Self-pay | Admitting: Family Medicine

## 2023-12-14 VITALS — BP 137/76 | HR 97 | Resp 16 | Ht 65.0 in | Wt 99.8 lb

## 2023-12-14 DIAGNOSIS — Z8669 Personal history of other diseases of the nervous system and sense organs: Secondary | ICD-10-CM | POA: Diagnosis not present

## 2023-12-14 DIAGNOSIS — R7303 Prediabetes: Secondary | ICD-10-CM | POA: Diagnosis not present

## 2023-12-14 DIAGNOSIS — E038 Other specified hypothyroidism: Secondary | ICD-10-CM | POA: Diagnosis not present

## 2023-12-14 DIAGNOSIS — E559 Vitamin D deficiency, unspecified: Secondary | ICD-10-CM | POA: Diagnosis not present

## 2023-12-14 DIAGNOSIS — R7301 Impaired fasting glucose: Secondary | ICD-10-CM | POA: Diagnosis not present

## 2023-12-14 DIAGNOSIS — E7849 Other hyperlipidemia: Secondary | ICD-10-CM

## 2023-12-14 NOTE — Patient Instructions (Addendum)
 I appreciate the opportunity to provide care to you today!    Follow up:  11/2024  Labs: please stop by the lab 2-3 days before your next appointment to get your blood drawn (CBC, CMP, TSH, Lipid profile, HgA1c, Vit D)  For a Healthier YOU, I Recommend: Reducing your intake of sugar, sodium, carbohydrates, and saturated fats. Increasing your fiber intake by incorporating more whole grains, fruits, and vegetables into your meals. Setting healthy goals with a focus on lowering your consumption of carbs, sugar, and unhealthy fats. Adding variety to your diet by including a wide range of fruits and vegetables. Cutting back on soda and limiting processed foods as much as possible. Staying active: In addition to taking your weight loss medication, aim for at least 150 minutes of moderate-intensity physical activity each week for optimal results.    Please continue to a heart-healthy diet and increase your physical activities. Try to exercise for at least five days a week.    It was a pleasure to see you and I look forward to continuing to work together on your health and well-being. Please do not hesitate to call the office if you need care or have questions about your care.  In case of emergency, please visit the Emergency Department for urgent care, or contact our clinic at [phone] to schedule an appointment. We're here to help you!   Have a wonderful day and week. With Gratitude, Meade JENEANE Gerlach MSN, FNP-BC, PMHNP-BC

## 2023-12-14 NOTE — Assessment & Plan Note (Signed)
 Lifestyle modifications for prediabetes were discussed, including adopting a heart-healthy diet and increasing physical activity. The patient was encouraged to decrease her intake of high-sugar foods and beverages. She verbalized understanding and is aware of the plan of care.

## 2023-12-14 NOTE — Progress Notes (Signed)
 Established Patient Office Visit  Subjective:  Patient ID: Heidi Vasquez, female    DOB: [birth year]  Age: 83 y.o. MRN: [id number]  CC:  Chief Complaint  Patient presents with   Follow-up    Follow up and lab review     HPI Heidi Vasquez is a 83 y.o. female with past medical history of hx of seizures, prediabetes presents for f/u of  chronic medical conditions.  For the details of today's visit, please refer to the assessment and plan.     Past Medical History:  Diagnosis Date   Allergic rhinitis    Convulsions (HCC)    Diverticulosis    Heart murmur    MVP (mitral valve prolapse)    Osteopenia    Intolerant of Fosamax     Past Surgical History:  Procedure Laterality Date   TUBAL LIGATION      Family History  Problem Relation Age of Onset   Stroke Mother    Diabetes Mother    Hypertension Mother    Colon cancer Father    Breast cancer Sister 49    Social History   Socioeconomic History   Marital status: Divorced    Spouse name: Not on file   Number of children: 2   Years of education: 16   Highest education level: Not on file  Occupational History   Occupation: Trial Coordinator    Comment: Scientific laboratory technician  Tobacco Use   Smoking status: Never   Smokeless tobacco: Never  Vaping Use   Vaping status: Never Used  Substance and Sexual Activity   Alcohol use: No   Drug use: No   Sexual activity: Not Currently    Partners: Male    Birth control/protection: Post-menopausal    Comment: older than 16, less than 5  Other Topics Concern   Not on file  Social History Narrative   Lives alone   Caffeine use-none   Social Drivers of Health   Financial Resource Strain: Low Risk  (11/24/2023)   Overall Financial Resource Strain (CARDIA)    Difficulty of Paying Living Expenses: Not hard at all  Food Insecurity: No Food Insecurity (11/24/2023)   Hunger Vital Sign    Worried About Running Out of Food in the Last Year: Never true    Ran Out of Food in the  Last Year: Never true  Transportation Needs: No Transportation Needs (11/24/2023)   PRAPARE - Administrator, Civil Service (Medical): No    Lack of Transportation (Non-Medical): No  Physical Activity: Sufficiently Active (11/24/2023)   Exercise Vital Sign    Days of Exercise per Week: 7 days    Minutes of Exercise per Session: 30 min  Stress: No Stress Concern Present (11/24/2023)   [employer] of Occupational Health - Occupational Stress Questionnaire    Feeling of Stress: Not at all  Social Connections: Moderately Isolated (11/24/2023)   Social Connection and Isolation Panel    Frequency of Communication with Friends and Family: More than three times a week    Frequency of Social Gatherings with Friends and Family: Twice a week    Attends Religious Services: Never    Database administrator or Organizations: Yes    Attends Engineer, structural: More than 4 times per year    Marital Status: Divorced  Intimate Partner Violence: Not At Risk (11/24/2023)   Humiliation, Afraid, Rape, and Kick questionnaire    Fear of Current or Ex-Partner: No    Emotionally  Abused: No    Physically Abused: No    Sexually Abused: No    Outpatient Medications Prior to Visit  Medication Sig Dispense Refill   levETIRAcetam  (KEPPRA ) 500 MG tablet Take 1 tablet (500 mg total) by mouth 2 (two) times daily. 180 tablet 4   Multiple Vitamins-Minerals (MULTIVITAMIN PO) Take by mouth daily as needed (for supplement).      Oyster Shell (OYSTER CALCIUM) 500 MG TABS tablet Take 500 mg of elemental calcium by mouth daily.     alendronate  (FOSAMAX ) 70 MG tablet Take 1 tablet (70 mg total) by mouth every 7 (seven) days. Take first thing in am with 6 oz. Water.  Be upright after taking.  Eat nothing for one hour. (Patient not taking: Reported on 12/14/2023) 12 tablet 0   Facility-Administered Medications Prior to Visit  Medication Dose Route Frequency Provider Last Rate Last Admin   denosumab   (PROLIA ) injection 60 mg  60 mg Subcutaneous Once        Romosozumab -aqqg (EVENITY ) 105 MG/1. injection 210 mg  210 mg Subcutaneous Once         No Known Allergies  ROS Review of Systems  Constitutional:  Negative for chills and fever.  Eyes:  Negative for visual disturbance.  Respiratory:  Negative for chest tightness and shortness of breath.   Neurological:  Negative for dizziness and headaches.      Objective:    Physical Exam HENT:     Head: Normocephalic.     Mouth/Throat:     Mouth: Mucous membranes are moist.  Cardiovascular:     Rate and Rhythm: Normal rate.     Heart sounds: Normal heart sounds.  Pulmonary:     Effort: Pulmonary effort is normal.     Breath sounds: Normal breath sounds.  Neurological:     Mental Status: She is alert.     BP 137/76   Pulse 97   Resp 16   Ht 5' 5 (1.651 m)   Wt 99 lb 12.8 oz (45.3 kg)   LMP 02/25/1992   SpO2 95%   BMI 16.61 kg/m  Wt Readings from Last 3 Encounters:  12/14/23 99 lb 12.8 oz (45.3 kg)  11/24/23 102 lb (46.3 kg)  05/14/23 103 lb (46.7 kg)    Lab Results  Component Value Date   TSH 2.210 12/10/2023   Lab Results  Component Value Date   WBC 8.4 12/10/2023   HGB 13.3 12/10/2023   HCT 40.8 12/10/2023   MCV 97 12/10/2023   PLT 263 12/10/2023   Lab Results  Component Value Date   NA 140 12/10/2023   K 4.3 12/10/2023   CO2 25 12/10/2023   GLUCOSE 96 12/10/2023   BUN 20 12/10/2023   CREATININE 0.92 12/10/2023   BILITOT 0.7 12/10/2023   ALKPHOS 123 12/10/2023   AST 27 12/10/2023   ALT 25 12/10/2023   PROT 7.0 12/10/2023   ALBUMIN 4.2 12/10/2023   CALCIUM 9.5 12/10/2023   EGFR 62 12/10/2023   Lab Results  Component Value Date   CHOL 166 12/10/2023   Lab Results  Component Value Date   HDL 78 12/10/2023   Lab Results  Component Value Date   LDLCALC 74 12/10/2023   Lab Results  Component Value Date   TRIG 72 12/10/2023   Lab Results  Component Value Date   CHOLHDL 2.1  12/10/2023   Lab Results  Component Value Date   HGBA1C 5.8 (H) 12/10/2023      Assessment &  Plan:  Prediabetes Assessment & Plan: Lifestyle modifications for prediabetes were discussed, including adopting a heart-healthy diet and increasing physical activity. The patient was encouraged to decrease her intake of high-sugar foods and beverages. She verbalized understanding and is aware of the plan of care.    Hx of seizure disorder Assessment & Plan: Stable No complaints or concerns   IFG (impaired fasting glucose) -     Hemoglobin A1c  Vitamin D  deficiency -     VITAMIN D  25 Hydroxy (Vit-D Deficiency, Fractures)  TSH (thyroid-stimulating hormone deficiency) -     TSH + free T4  Other hyperlipidemia -     Lipid panel -     CMP14+EGFR -     CBC with Differential/Platelet  Note: This chart has been completed using Engineer, civil (consulting) software, and while attempts have been made to ensure accuracy, certain words and phrases may not be transcribed as intended.    Follow-up: No follow-ups on file.   Azusena Erlandson  Z Bacchus, FNP

## 2023-12-14 NOTE — Assessment & Plan Note (Signed)
 Stable No complaints or concerns

## 2024-01-29 ENCOUNTER — Ambulatory Visit (INDEPENDENT_AMBULATORY_CARE_PROVIDER_SITE_OTHER)

## 2024-01-29 DIAGNOSIS — M810 Age-related osteoporosis without current pathological fracture: Secondary | ICD-10-CM | POA: Diagnosis not present

## 2024-01-29 NOTE — Progress Notes (Signed)
 Evenity  injection given SQ left arm.  Patient tolerated injection well.   Annual exam: 12-30-22 EB Calcium:   10         Date: 02/24/23 GFR:        70          Date: 02/24/24  Upcoming dental procedures: No  Hx of Kidney Disease: No  Heart attack or stroke in the last year: No Last Bone Density Scan: 11-17-22

## 2024-02-05 ENCOUNTER — Ambulatory Visit

## 2024-03-02 ENCOUNTER — Other Ambulatory Visit: Payer: Self-pay | Admitting: *Deleted

## 2024-03-02 ENCOUNTER — Encounter: Payer: Self-pay | Admitting: *Deleted

## 2024-03-02 DIAGNOSIS — M810 Age-related osteoporosis without current pathological fracture: Secondary | ICD-10-CM

## 2024-03-02 MED ORDER — ROMOSOZUMAB-AQQG 105 MG/1.17ML ~~LOC~~ SOSY: 210.0000 mg | PREFILLED_SYRINGE | SUBCUTANEOUS | Status: AC

## 2024-03-04 ENCOUNTER — Telehealth: Payer: Self-pay

## 2024-03-04 ENCOUNTER — Other Ambulatory Visit (HOSPITAL_COMMUNITY): Payer: Self-pay

## 2024-03-04 NOTE — Telephone Encounter (Signed)
 Evenity  VOB initiated via [email or web address]  Last OV:  Next OV:  Last Evenity  inj: 01/29/24 Next Evenity  inj DUE: NOW

## 2024-03-09 ENCOUNTER — Other Ambulatory Visit (HOSPITAL_COMMUNITY): Payer: Self-pay

## 2024-03-09 NOTE — Telephone Encounter (Signed)
 [name]

## 2024-03-09 NOTE — Telephone Encounter (Addendum)
 MEDICAL PA SUBMITTED VIA LATENT. KEY: [id number]      PHARMACY PA SUBMITTED VIA LATENT. KEY: [id number]

## 2024-03-10 NOTE — Telephone Encounter (Signed)
 Buy/Bill (Office supplied medication)  - MEDICAL BENEFIT VIA SPECIALTY PHARMACY  Out-of-pocket cost due at time of clinic visit: $40  Number of injection/visits approved: 12  Primary: [employer] Co-insurance: 0% Admin fee co-insurance: $40  Secondary: --- Co-insurance:  Admin fee co-insurance:   Medical Benefit Details: Date Benefits were checked: 03/08/24 Deductible: NO/ Coinsurance: 0%/ Admin Fee: $40  Prior Auth: APPROVED PA# [id number] Expiration Date: 01/29/23-02/23/25   # of doses approved: 12 -----------------------------------------------------------------------  Patient NOT eligible for Copay Card. Copay Card can make patient's cost as little as $25. Link to apply: [email or web address]://[email or web address]  ** This summary of benefits is an estimation of the patient's out-of-pocket cost. Exact cost may very based on individual plan coverage.

## 2024-04-05 ENCOUNTER — Encounter

## 2024-04-05 ENCOUNTER — Other Ambulatory Visit

## 2024-12-13 ENCOUNTER — Encounter: Admitting: Family Medicine
